# Patient Record
Sex: Male | Born: 1970
Health system: Southern US, Community
[De-identification: ages and names within clinical notes are randomized; demographics above are authoritative.]

## PROBLEM LIST (undated history)

## (undated) DIAGNOSIS — D75839 Thrombocytosis, unspecified: Secondary | ICD-10-CM

## (undated) DIAGNOSIS — J309 Allergic rhinitis, unspecified: Secondary | ICD-10-CM

## (undated) DIAGNOSIS — I10 Essential (primary) hypertension: Secondary | ICD-10-CM

## (undated) DIAGNOSIS — D473 Essential (hemorrhagic) thrombocythemia: Secondary | ICD-10-CM

## (undated) DIAGNOSIS — E785 Hyperlipidemia, unspecified: Secondary | ICD-10-CM

## (undated) DIAGNOSIS — L409 Psoriasis, unspecified: Secondary | ICD-10-CM

## (undated) HISTORY — DX: Hyperlipidemia, unspecified: E78.5

## (undated) HISTORY — PX: KNEE SURGERY: SHX244

## (undated) HISTORY — DX: Psoriasis, unspecified: L40.9

## (undated) HISTORY — DX: Essential (hemorrhagic) thrombocythemia: D47.3

## (undated) HISTORY — DX: Essential (primary) hypertension: I10

## (undated) HISTORY — DX: Thrombocytosis, unspecified: D75.839

## (undated) HISTORY — DX: Allergic rhinitis, unspecified: J30.9

---

## 2012-09-02 ENCOUNTER — Encounter: Payer: Self-pay | Admitting: Internal Medicine

## 2012-09-02 ENCOUNTER — Ambulatory Visit: Payer: Self-pay | Admitting: Internal Medicine

## 2012-09-02 ENCOUNTER — Ambulatory Visit (INDEPENDENT_AMBULATORY_CARE_PROVIDER_SITE_OTHER): Payer: BC Managed Care – PPO | Admitting: Internal Medicine

## 2012-09-02 VITALS — BP 132/88 | HR 90 | Temp 98.9°F | Ht 67.0 in | Wt 223.0 lb

## 2012-09-02 DIAGNOSIS — Z0000 Encounter for general adult medical examination without abnormal findings: Secondary | ICD-10-CM

## 2012-09-02 DIAGNOSIS — R030 Elevated blood-pressure reading, without diagnosis of hypertension: Secondary | ICD-10-CM

## 2012-09-02 DIAGNOSIS — D75839 Thrombocytosis, unspecified: Secondary | ICD-10-CM

## 2012-09-02 DIAGNOSIS — L409 Psoriasis, unspecified: Secondary | ICD-10-CM

## 2012-09-02 DIAGNOSIS — D473 Essential (hemorrhagic) thrombocythemia: Secondary | ICD-10-CM

## 2012-09-02 DIAGNOSIS — L408 Other psoriasis: Secondary | ICD-10-CM

## 2012-09-02 DIAGNOSIS — Z23 Encounter for immunization: Secondary | ICD-10-CM

## 2012-09-02 NOTE — Assessment & Plan Note (Addendum)
Reviewed adult health maintenance protocols.  Patient has pre-hypertension. Encouraged regular exercise and low salt diet. He also discussed goal weight loss of 25 pounds over next 6 months. His BMI is elevated. Obtain screening labs. Patient updated with tetanus vaccine. He declines influenza vaccine. Colon cancer and prostate cancer screening deferred until age 40

## 2012-09-02 NOTE — Patient Instructions (Addendum)
Please monitor your blood pressure at home.  Return office within 2 months if blood pressure consistently greater than 130/80 Goal weight loss - 25 lbs over next 6 months Make dietary changes as discussed (lower your carbohydrate intake, low salt diet - [email or web address].org)

## 2012-09-02 NOTE — Assessment & Plan Note (Signed)
He has mild psoriasis limited to neck and groin area. He is followed by local dermatologist

## 2012-09-02 NOTE — Assessment & Plan Note (Signed)
Patient reports history of thrombocytosis. Monitor platelet counts.

## 2012-09-02 NOTE — Progress Notes (Signed)
Subjective:    Patient ID: Samuel Lyons, male    DOB: Nov 10, 1970, 42 y.o.   MRN: [id number]  HPI  43 year old African American male to establish and for routine physical. He denies any chronic medical problems. Patient reports episode of elevated blood pressure when he was a child. Patient reports blood pressure issues resolved on its own. He is followed by local dermatologist for psoriasis. His skin lesions are limited to neck, scalp and groin area.  Patient also reports history of unexplained thrombocytosis. He has not followed by hematologist.  Denies any family history of prostate cancer or colon cancer.  He tries to exercise to 3 times per week   Review of Systems  Constitutional: Negative for activity change, appetite change and unexpected weight change.  Eyes: Negative for visual disturbance.  Respiratory: Negative for cough, chest tightness and shortness of breath.   Cardiovascular: Negative for chest pain.  Genitourinary: Negative for difficulty urinating.  Neurological: Negative for headaches.  Gastrointestinal: Negative for abdominal pain, heartburn melena or hematochezia Psych: Negative for depression or anxiety ID: denies hx of STDs    Past Medical History  Diagnosis Date  . Allergic rhinitis   . Psoriasis   . Thrombocytosis     History   Social History  . Marital Status: Single    Spouse Name: N/A    Number of Children: N/A  . Years of Education: N/A   Occupational History  . Economist   Social History Main Topics  . Smoking status: Never Smoker   . Smokeless tobacco: Not on file  . Alcohol Use: Yes  . Drug Use: No  . Sexually Active: Not on file   Other Topics Concern  . Not on file   Social History Narrative   Married for 7 years- wife's name is MarlaG[place] as a Camera operator for [employer] children-twins fraternal (son & daughter age 47), son 44    No past surgical history on  file.  Family History  Problem Relation Age of Onset  . Ulcers Mother   . Hypertension Maternal Grandmother   . Stroke Maternal Grandfather   . Ovarian cancer Paternal Aunt     No Known Allergies  No current outpatient prescriptions on file prior to visit.    BP 132/88  Pulse 90  Temp 98.9 F (37.2 C) (Oral)  Ht 5\' 7"  (1.702 m)  Wt 223 lb (101.152 kg)  BMI 34.93 kg/m2  EKG shows normal sinus rhythm at 70 beats per minute  Objective:   Physical Exam  Constitutional: He is oriented to person, place, and time. He appears well-developed and well-nourished.  HENT:  Head: Normocephalic and atraumatic.  Right Ear: External ear normal.  Left Ear: External ear normal.  Mouth/Throat: Oropharynx is clear and moist.  Eyes: EOM are normal. Pupils are equal, round, and reactive to light.  Neck: Neck supple.       No carotid bruit  Cardiovascular: Normal rate, regular rhythm, normal heart sounds and intact distal pulses.   No murmur heard. Pulmonary/Chest: Effort normal and breath sounds normal. He has no wheezes.  Abdominal: Soft. Bowel sounds are normal. He exhibits no mass. There is no tenderness.  Genitourinary: Penis normal.       Right testicle smaller left, otherwise normal  Musculoskeletal: Normal range of motion. He exhibits no edema.  Lymphadenopathy:    He has no cervical adenopathy.  Neurological: He is alert and oriented to person, place, and  time. No cranial nerve deficit.  Skin: Skin is warm and dry.       Silvery plaques on right upper neck and left scrotal area  Psychiatric: He has a normal mood and affect. His behavior is normal.          Assessment & Plan:

## 2012-09-05 ENCOUNTER — Ambulatory Visit (INDEPENDENT_AMBULATORY_CARE_PROVIDER_SITE_OTHER): Payer: BC Managed Care – PPO | Admitting: Family

## 2012-09-05 ENCOUNTER — Encounter: Payer: Self-pay | Admitting: Family

## 2012-09-05 ENCOUNTER — Telehealth: Payer: Self-pay | Admitting: Internal Medicine

## 2012-09-05 VITALS — BP 122/78 | HR 68 | Temp 98.1°F | Wt 220.0 lb

## 2012-09-05 DIAGNOSIS — R220 Localized swelling, mass and lump, head: Secondary | ICD-10-CM

## 2012-09-05 DIAGNOSIS — T8062XA Other serum reaction due to vaccination, initial encounter: Secondary | ICD-10-CM

## 2012-09-05 DIAGNOSIS — T50Z95A Adverse effect of other vaccines and biological substances, initial encounter: Secondary | ICD-10-CM

## 2012-09-05 MED ORDER — METHYLPREDNISOLONE ACETATE 40 MG/ML IJ SUSP
80.0000 mg | Freq: Once | INTRAMUSCULAR | Status: AC
  Administered 2012-09-05: 80 mg via INTRAMUSCULAR

## 2012-09-05 NOTE — Telephone Encounter (Signed)
Patient Information: ° Caller Name: Braian ° Phone: [phone] ° Patient: Samuel Lyons, Samuel Lyons ° Gender: Male ° DOB: 07/25/1971 ° Age: 41 Years ° PCP: Yoo, Doe-Hyun (Robert) (Adults only) ° °Office Follow Up: ° Does the office need to follow up with this patient?: No ° Instructions For The Office: N/A ° °RN Note: ° Patient already has an appt scheduled for 0915 this am.  Concerned about having to pay a copay for this reaction? ° °Symptoms ° Reason For Call & Symptoms: Had a tetanus booster on 1/21 and developed facial swelling and tingling on 1/22. Continues to have swelling.  Denies any difficulty swallowing or breathing.  He took Benadryl this am. ° Reviewed Health History In EMR: Yes ° Reviewed Medications In EMR: Yes ° Reviewed Allergies In EMR: Yes ° Reviewed Surgeries / Procedures: Yes ° Date of Onset of Symptoms: 09/02/2012 ° Treatments Tried: Benadryl ° Treatments Tried Worked: No ° °Guideline(s) Used: ° Face Swelling ° °Disposition Per Guideline:  ° Go to Office Now ° °Reason For Disposition Reached:  ° Swelling began after taking a drug ° °Advice Given: ° N/A ° ° °

## 2012-09-05 NOTE — Telephone Encounter (Signed)
Patient Information:  Caller Name: Enzio  Phone: [phone]  Patient: Samuel Lyons, Samuel Lyons  Gender: Male  DOB: 27-Feb-1971  Age: 42 Years  PCP: Artist Pais Doe-Hyun Molly Maduro) (Adults only)  Office Follow Up:  Does the office need to follow up with this patient?: No  Instructions For The Office: N/A  RN Note:  Patient already has an appt scheduled for 0915 this am.  Concerned about having to pay a copay for this reaction?  Symptoms  Reason For Call & Symptoms: Had a tetanus booster on 1/21 and developed facial swelling and tingling on 1/22. Continues to have swelling.  Denies any difficulty swallowing or breathing.  He took Benadryl this am.  Reviewed Health History In EMR: Yes  Reviewed Medications In EMR: Yes  Reviewed Allergies In EMR: Yes  Reviewed Surgeries / Procedures: Yes  Date of Onset of Symptoms: 09/02/2012  Treatments Tried: Benadryl  Treatments Tried Worked: No  Guideline(s) Used:  Face Swelling  Disposition Per Guideline:   Go to Office Now  Reason For Disposition Reached:   Swelling began after taking a drug  Advice Given:  N/A

## 2012-09-05 NOTE — Progress Notes (Signed)
  Subjective:    Patient ID: Samuel Lyons, male    DOB: January 11, 1971, 42 y.o.   MRN: [id number]  HPI    Review of Systems  HENT:       Lip swelling       Objective:   Physical Exam  HENT:       Left upper lip, moderately swollen. nontender          Assessment & Plan:

## 2012-09-05 NOTE — Patient Instructions (Addendum)
Immunization Reaction You are having a reaction to your immunization. Reactions can show up as:  Tenderness or a red area at the injection site.  Fever.  Rash.  Muscle aches.  Headache. Tiredness may develop over the first 2 to 3 days after your shot. Severe reactions are very rare. Only take over-the-counter or prescription medicines for pain, discomfort, or fever as directed by your caregiver.  The DPT shot (diphtheria, tetanus, pertussis) may leave a painless lump at the injection site for several months. The MMR (measles, mumps, rubella) and VAR (chickenpox) vaccines can sometimes cause a fever, rash, or joint pain and swelling 2 weeks after the shot. Get plenty of rest and take your medicines as prescribed. See your doctor if your symptoms get worse, or if you are not better within 3 days. Document Released: 07/30/2005 Document Revised: 10/22/2011 Document Reviewed: 01/17/2007 [facility] Patient Information 2013 [place], [place].

## 2012-09-05 NOTE — Progress Notes (Signed)
  Subjective:    Patient ID: Samuel Lyons, male    DOB: 04-Feb-1971, 42 y.o.   MRN: [id number]  HPI 42 year old African American male, nonsmoker, patient of Dr. Artist Pais is in today with complaints of left upper lip swelling after receiving a TDAP immunization 2 days ago at physical exam. Reports symptoms originally starting of his tingling in his upper lip and swelling gradually follow. Swelling was worse at about 2:00 this morning at which she took Benadryl that helped. He continues to have swelling. He denies any changes in foods, medications, no new drinks. No changes in his routine. Denies any redness, irritation, swelling at the injection site. Has never had a reaction to tetanus before.    Review of Systems  Constitutional: Negative.   Cardiovascular: Negative.   Gastrointestinal: Negative.   Musculoskeletal: Negative.   Skin: Negative.   Neurological: Negative.   Psychiatric/Behavioral: Negative.    Past Medical History  Diagnosis Date  . Allergic rhinitis   . Psoriasis   . Thrombocytosis     History   Social History  . Marital Status: Single    Spouse Name: N/A    Number of Children: N/A  . Years of Education: N/A   Occupational History  . Economist   Social History Main Topics  . Smoking status: Never Smoker   . Smokeless tobacco: Not on file  . Alcohol Use: Yes  . Drug Use: No  . Sexually Active: Not on file   Other Topics Concern  . Not on file   Social History Narrative   Married for 7 years- wife's name is MarlaGrew up in [place] as a Camera operator for [employer] children-twins fraternal (son & daughter age 40), son 40    No past surgical history on file.  Family History  Problem Relation Age of Onset  . Ulcers Mother   . Hypertension Maternal Grandmother   . Stroke Maternal Grandfather   . Ovarian cancer Paternal Aunt     No Known Allergies  Current Outpatient Prescriptions on File Prior to Visit    Medication Sig Dispense Refill  . ketoconazole (NIZORAL) 2 % shampoo Once daily as needed.       No current facility-administered medications on file prior to visit.    BP 122/78  Pulse 68  Temp 98.1 F (36.7 C) (Oral)  Wt 220 lb (99.791 kg)  SpO2 98%chart   Objective:   Physical Exam  Constitutional: He is oriented to person, place, and time. He appears well-developed and well-nourished.  HENT:  Right Ear: External ear normal.  Left Ear: External ear normal.  Nose: Nose normal.  Mouth/Throat: Oropharynx is clear and moist.  Neck: Normal range of motion. Neck supple.  Cardiovascular: Normal rate, regular rhythm and normal heart sounds.   Pulmonary/Chest: Effort normal and breath sounds normal.  Neurological: He is alert and oriented to person, place, and time.  Skin: Skin is warm and dry.          Assessment & Plan:  Assessment: 1. Allergic Reaction to Tdap 2. Swelling of the upper lip  Plan: Depo-Medrol 80 mg IM x1. Benadryl as needed. Patient advised to call the office if symptoms worsen or persist. Recheck as scheduled, and as needed.

## 2013-09-16 ENCOUNTER — Other Ambulatory Visit (INDEPENDENT_AMBULATORY_CARE_PROVIDER_SITE_OTHER): Payer: BC Managed Care – PPO

## 2013-09-16 DIAGNOSIS — Z0000 Encounter for general adult medical examination without abnormal findings: Secondary | ICD-10-CM

## 2013-09-16 LAB — POCT URINALYSIS DIPSTICK
BILIRUBIN UA: NEGATIVE
Glucose, UA: NEGATIVE
Ketones, UA: NEGATIVE
Leukocytes, UA: NEGATIVE
NITRITE UA: NEGATIVE
RBC UA: NEGATIVE
Spec Grav, UA: >=1.03
UROBILINOGEN UA: 0.2
pH, UA: 6

## 2013-09-16 LAB — BASIC METABOLIC PANEL
BUN: 9 mg/dL (ref 6–23)
CHLORIDE: 102 meq/L (ref 96–112)
CO2: 28 mEq/L (ref 19–32)
Calcium: 9.3 mg/dL (ref 8.4–10.5)
Creatinine, Ser: 1.1 mg/dL (ref 0.4–1.5)
GFR: 100.54 mL/min (ref >60.00)
Glucose, Bld: 77 mg/dL (ref 70–99)
POTASSIUM: 4.4 meq/L (ref 3.5–5.1)
SODIUM: 138 meq/L (ref 135–145)

## 2013-09-16 LAB — HEPATIC FUNCTION PANEL
ALT: 35 U/L (ref 0–53)
AST: 31 U/L (ref 0–37)
Albumin: 4 g/dL (ref 3.5–5.2)
Alkaline Phosphatase: 105 U/L (ref 39–117)
BILIRUBIN TOTAL: 0.8 mg/dL (ref 0.3–1.2)
Bilirubin, Direct: 0.1 mg/dL (ref 0.0–0.3)
TOTAL PROTEIN: 6.9 g/dL (ref 6.0–8.3)

## 2013-09-16 LAB — LIPID PANEL
Cholesterol: 197 mg/dL (ref 0–200)
HDL: 42 mg/dL (ref >39.00)
LDL CALC: 121 mg/dL — AB (ref 0–99)
Total CHOL/HDL Ratio: 5
Triglycerides: 168 mg/dL — ABNORMAL HIGH (ref 0.0–149.0)
VLDL: 33.6 mg/dL (ref 0.0–40.0)

## 2013-09-16 LAB — CBC WITH DIFFERENTIAL/PLATELET
BASOS PCT: 0.3 % (ref 0.0–3.0)
Basophils Absolute: 0 10*3/uL (ref 0.0–0.1)
EOS PCT: 4.1 % (ref 0.0–5.0)
Eosinophils Absolute: 0.2 10*3/uL (ref 0.0–0.7)
HEMATOCRIT: 45.9 % (ref 39.0–52.0)
HEMOGLOBIN: 14.9 g/dL (ref 13.0–17.0)
LYMPHS ABS: 1.8 10*3/uL (ref 0.7–4.0)
Lymphocytes Relative: 31.7 % (ref 12.0–46.0)
MCHC: 32.3 g/dL (ref 30.0–36.0)
MCV: 81.9 fl (ref 78.0–100.0)
MONOS PCT: 5.8 % (ref 3.0–12.0)
Monocytes Absolute: 0.3 10*3/uL (ref 0.1–1.0)
NEUTROS ABS: 3.2 10*3/uL (ref 1.4–7.7)
Neutrophils Relative %: 58.1 % (ref 43.0–77.0)
Platelets: 240 10*3/uL (ref 150.0–400.0)
RBC: 5.61 Mil/uL (ref 4.22–5.81)
RDW: 14.4 % (ref 11.5–14.6)
WBC: 5.6 10*3/uL (ref 4.5–10.5)

## 2013-09-16 LAB — PSA: PSA: 0.35 ng/mL (ref 0.10–4.00)

## 2013-09-16 LAB — TSH: TSH: 1.4 u[IU]/mL (ref 0.35–5.50)

## 2013-09-23 ENCOUNTER — Ambulatory Visit (INDEPENDENT_AMBULATORY_CARE_PROVIDER_SITE_OTHER): Payer: BC Managed Care – PPO | Admitting: Internal Medicine

## 2013-09-23 ENCOUNTER — Encounter: Payer: Self-pay | Admitting: Internal Medicine

## 2013-09-23 VITALS — BP 142/94 | HR 72 | Temp 98.8°F | Ht 67.5 in | Wt 214.0 lb

## 2013-09-23 DIAGNOSIS — Z0000 Encounter for general adult medical examination without abnormal findings: Secondary | ICD-10-CM

## 2013-09-23 NOTE — Assessment & Plan Note (Addendum)
Reviewed adult health maintenance protocols. PSA and DRE normal.   I encouraged weight loss, regular exercise, and low salt diet.  Educational handout provided. Patient to monitor home blood pressure readings. Reevaluate in 2 months.  Screening labs reviewed in detail with patient.

## 2013-09-23 NOTE — Patient Instructions (Signed)
Start regular aerobic exercise as directed Follow low sodium diet Monitor your blood pressure at home with automated Blood pressure cuff

## 2013-09-23 NOTE — Addendum Note (Signed)
Addended by: Townsend Roger D on: 09/23/2013 03:31 PM   Modules accepted: Orders

## 2013-09-23 NOTE — Progress Notes (Signed)
Subjective:    Patient ID: Samuel Lyons, male    DOB: [birth year], 43 y.o.   MRN: [id number]  HPI  43 year old African American male with history of psoriasis for routine physical. At previous visit patient was given tetanus vaccine. He reports allergic reaction with lip swelling.  His symptoms resolved after getting IM Depo-Medrol 80 mg.  BP elevated today.  Social hx and family hx reviewed - no changes  Review of Systems  Constitutional: Negative for activity change, appetite change and unexpected weight change.  Eyes: Negative for visual disturbance.  Respiratory: Negative for cough, chest tightness and shortness of breath.   Cardiovascular: Negative for chest pain.  Genitourinary: Negative for difficulty urinating.  Neurological: Negative for headaches.  Gastrointestinal: Negative for abdominal pain, heartburn melena or hematochezia Psych: Negative for depression or anxiety Endo:  No polyuria or polydypsia        Past Medical History  Diagnosis Date  . Allergic rhinitis   . Psoriasis   . Thrombocytosis     History   Social History  . Marital Status: Single    Spouse Name: N/A    Number of Children: N/A  . Years of Education: N/A   Occupational History  . Engineer, technical sales   Social History Main Topics  . Smoking status: Never Smoker   . Smokeless tobacco: Not on file  . Alcohol Use: Yes  . Drug Use: No  . Sexual Activity: Not on file   Other Topics Concern  . Not on file   Social History Narrative   Married for 7 years- wife's name is Leeanne Mannan up in [place]   Works as a Psychologist, forensic for [employer]   3 children-twins fraternal (son & daughter age 18), son 39    No past surgical history on file.  Family History  Problem Relation Age of Onset  . Ulcers Mother   . Hypertension Maternal Grandmother   . Stroke Maternal Grandfather   . Ovarian cancer Paternal Aunt     No Known Allergies  Current Outpatient  Prescriptions on File Prior to Visit  Medication Sig Dispense Refill  . ketoconazole (NIZORAL) 2 % shampoo Once daily as needed.       No current facility-administered medications on file prior to visit.    BP 142/94  Pulse 72  Temp(Src) 98.8 F (37.1 C) (Oral)  Ht 5' 7.5" (1.715 m)  Wt 214 lb (97.07 kg)  BMI 33.00 kg/m2    Objective:   Physical Exam  Constitutional: He is oriented to person, place, and time. He appears well-developed and well-nourished. No distress.  HENT:  Head: Normocephalic and atraumatic.  Right Ear: External ear normal.  Left Ear: External ear normal.  Mouth/Throat: Oropharynx is clear and moist.  Eyes: EOM are normal. Pupils are equal, round, and reactive to light. No scleral icterus.  Neck: Neck supple.  Cardiovascular: Normal rate, regular rhythm, normal heart sounds and intact distal pulses.   No murmur heard. Pulmonary/Chest: Effort normal and breath sounds normal. He has no wheezes. He has no rales.  Abdominal: Soft. Bowel sounds are normal. He exhibits no mass. There is no tenderness.  Genitourinary: Rectum normal and prostate normal. Guaiac negative stool.  Musculoskeletal: Normal range of motion. He exhibits no edema.  Lymphadenopathy:    He has no cervical adenopathy.  Neurological: He is alert and oriented to person, place, and time. No cranial nerve deficit.  Skin: Skin is warm and dry.  Psychiatric: He has a normal mood and affect. His behavior is normal.          Assessment & Plan:

## 2013-09-23 NOTE — Progress Notes (Signed)
Pre visit review using our clinic review tool, if applicable. No additional management support is needed unless otherwise documented below in the visit note. 

## 2013-11-20 ENCOUNTER — Ambulatory Visit (INDEPENDENT_AMBULATORY_CARE_PROVIDER_SITE_OTHER): Payer: BC Managed Care – PPO | Admitting: Internal Medicine

## 2013-11-20 ENCOUNTER — Encounter: Payer: Self-pay | Admitting: Internal Medicine

## 2013-11-20 VITALS — BP 150/100 | HR 68 | Temp 98.5°F | Ht 67.5 in | Wt 208.0 lb

## 2013-11-20 DIAGNOSIS — I10 Essential (primary) hypertension: Secondary | ICD-10-CM | POA: Insufficient documentation

## 2013-11-20 DIAGNOSIS — J309 Allergic rhinitis, unspecified: Secondary | ICD-10-CM | POA: Insufficient documentation

## 2013-11-20 MED ORDER — LOSARTAN POTASSIUM 50 MG PO TABS: 50.0000 mg | ORAL_TABLET | Freq: Every day | ORAL | Status: DC

## 2013-11-20 MED ORDER — FLUTICASONE PROPIONATE 50 MCG/ACT NA SUSP: 2.0000 | Freq: Every day | NASAL | Status: DC

## 2013-11-20 MED ORDER — METHYLPREDNISOLONE ACETATE 80 MG/ML IJ SUSP
80.0000 mg | Freq: Once | INTRAMUSCULAR | Status: AC
  Administered 2013-11-20: 80 mg via INTRAMUSCULAR

## 2013-11-20 MED ORDER — DESLORATADINE 5 MG PO TABS: 5.0000 mg | ORAL_TABLET | Freq: Every day | ORAL | Status: DC

## 2013-11-20 NOTE — Patient Instructions (Signed)
Please complete the following lab tests before your next follow up appointment: BMET - 401.9 Reduce your sodium intake to 3 grams per day Please contact our office if your allegy symptoms do not improve or gets worse.

## 2013-11-20 NOTE — Assessment & Plan Note (Signed)
Patient has stage I hypertension. Start losartan 50 mg once daily. Continue regular exercise low salt diet. Reassess in 4 weeks. Monitor electrolytes and kidney function. BP: 150/100 mmHg

## 2013-11-20 NOTE — Assessment & Plan Note (Signed)
Patient having significant flare of allergic rhinitis. He has failed over-the-counter nonsedating antihistamines and nasal steroids. Patient given Depo-Medrol 80 mg IM x1.  Continue intranasal steroids, antihistamine (over-the-counter), and intranasal saline. Patient advised to call office if symptoms persist or worsen.

## 2013-11-20 NOTE — Progress Notes (Signed)
Pre visit review using our clinic review tool, if applicable. No additional management support is needed unless otherwise documented below in the visit note. 

## 2013-11-20 NOTE — Progress Notes (Signed)
   Subjective:    Patient ID: Samuel Lyons, male    DOB: 10-Dec-1970, 43 y.o.   MRN: [id number]  HPI  43 year old African American male for followup regarding pre-hypertension. Patient has not been monitoring his blood pressure at home. Patient has made effort towards weight loss and has lost approximately 6 pounds since previous visit.  Patient complains today of hoarseness and postnasal drip. Symptoms worse at night. He has history of seasonal allergies. However this year has been particularly bad for allergy symptoms. He denies any sore throat. He denies any facial pressure sinus pain.   Review of Systems Negative for fever chills, negative for sinus pain    Past Medical History  Diagnosis Date  . Allergic rhinitis   . Psoriasis   . Thrombocytosis     History   Social History  . Marital Status: Single    Spouse Name: N/A    Number of Children: N/A  . Years of Education: N/A   Occupational History  . Engineer, technical sales   Social History Main Topics  . Smoking status: Never Smoker   . Smokeless tobacco: Not on file  . Alcohol Use: Yes  . Drug Use: No  . Sexual Activity: Not on file   Other Topics Concern  . Not on file   Social History Narrative   Married for 7 years- wife's name is Leeanne Mannan up in [place]   Works as a Psychologist, forensic for [employer]   3 children-twins fraternal (son & daughter age 35), son 50    No past surgical history on file.  Family History  Problem Relation Age of Onset  . Ulcers Mother   . Hypertension Maternal Grandmother   . Stroke Maternal Grandfather   . Ovarian cancer Paternal Aunt     Allergies  Allergen Reactions  . Tetanus Toxoids Swelling    Lip swelling    Current Outpatient Prescriptions on File Prior to Visit  Medication Sig Dispense Refill  . ketoconazole (NIZORAL) 2 % shampoo Once daily as needed.       No current facility-administered medications on file prior to visit.    BP  150/100  Pulse 68  Temp(Src) 98.5 F (36.9 C) (Oral)  Ht 5' 7.5" (1.715 m)  Wt 208 lb (94.348 kg)  BMI 32.08 kg/m2      Objective:   Physical Exam  Constitutional: He is oriented to person, place, and time. He appears well-developed and well-nourished. No distress.  HENT:  Head: Normocephalic and atraumatic.  Right Ear: External ear normal.  Left tympanic membrane slightly retracted, mild oropharyngeal erythema  Neck: Neck supple.  Cardiovascular: Normal rate, regular rhythm and normal heart sounds.   No murmur heard. Pulmonary/Chest: Effort normal and breath sounds normal. He has no wheezes.  Musculoskeletal: He exhibits no edema.  Lymphadenopathy:    He has no cervical adenopathy.  Neurological: He is oriented to person, place, and time. No cranial nerve deficit.  Skin: Skin is warm and dry.  2 x 3 cm tumor plaque below right mandible  Psychiatric: He has a normal mood and affect. His behavior is normal.          Assessment & Plan:

## 2013-11-25 ENCOUNTER — Ambulatory Visit: Payer: BC Managed Care – PPO | Admitting: Internal Medicine

## 2013-12-16 ENCOUNTER — Ambulatory Visit (INDEPENDENT_AMBULATORY_CARE_PROVIDER_SITE_OTHER): Payer: BC Managed Care – PPO | Admitting: Internal Medicine

## 2013-12-16 ENCOUNTER — Encounter: Payer: Self-pay | Admitting: Internal Medicine

## 2013-12-16 VITALS — BP 116/84 | HR 76 | Temp 98.0°F | Ht 67.5 in | Wt 208.0 lb

## 2013-12-16 DIAGNOSIS — I10 Essential (primary) hypertension: Secondary | ICD-10-CM

## 2013-12-16 LAB — BASIC METABOLIC PANEL
BUN: 12 mg/dL (ref 6–23)
CALCIUM: 9.4 mg/dL (ref 8.4–10.5)
CO2: 27 meq/L (ref 19–32)
Chloride: 102 mEq/L (ref 96–112)
Creatinine, Ser: 1 mg/dL (ref 0.4–1.5)
GFR: 102.67 mL/min (ref >60.00)
Glucose, Bld: 91 mg/dL (ref 70–99)
Potassium: 3.8 mEq/L (ref 3.5–5.1)
SODIUM: 135 meq/L (ref 135–145)

## 2013-12-16 MED ORDER — LOSARTAN POTASSIUM 50 MG PO TABS: 50.0000 mg | ORAL_TABLET | Freq: Every day | ORAL | Status: DC

## 2013-12-16 NOTE — Progress Notes (Signed)
Pre visit review using our clinic review tool, if applicable. No additional management support is needed unless otherwise documented below in the visit note. 

## 2013-12-16 NOTE — Progress Notes (Signed)
   Subjective:    Patient ID: Samuel Lyons, male    DOB: December 01, 1970, 43 y.o.   MRN: [id number]  HPI  43 year old [place] American male for followup regarding hypertension. At previous visit patient started on losartan 50 mg once daily. Patient denies any adverse effects. He denies dizziness or lightheadedness.  His blood pressure is well-controlled.   Review of Systems Negative for chest pain    Past Medical History  Diagnosis Date  . Allergic rhinitis   . Psoriasis   . Thrombocytosis     History   Social History  . Marital Status: Single    Spouse Name: N/A    Number of Children: N/A  . Years of Education: N/A   Occupational History  . Engineer, technical sales   Social History Main Topics  . Smoking status: Never Smoker   . Smokeless tobacco: Not on file  . Alcohol Use: Yes  . Drug Use: No  . Sexual Activity: Not on file   Other Topics Concern  . Not on file   Social History Narrative   Married for 7 years- wife's name is Samuel Lyons up in [place]   Works as a Psychologist, forensic for [employer]   3 children-twins fraternal (son & daughter age 40), son 6    No past surgical history on file.  Family History  Problem Relation Age of Onset  . Ulcers Mother   . Hypertension Maternal Grandmother   . Stroke Maternal Grandfather   . Ovarian cancer Paternal Aunt     Allergies  Allergen Reactions  . Tetanus Toxoids Swelling    Lip swelling    Current Outpatient Prescriptions on File Prior to Visit  Medication Sig Dispense Refill  . desloratadine (CLARINEX) 5 MG tablet Take 1 tablet (5 mg total) by mouth daily.  90 tablet  1  . fluticasone (FLONASE) 50 MCG/ACT nasal spray Place 2 sprays into both nostrils daily.  16 g  5  . ketoconazole (NIZORAL) 2 % shampoo Once daily as needed.       No current facility-administered medications on file prior to visit.    BP 116/84  Pulse 76  Temp(Src) 98 F (36.7 C) (Oral)  Ht 5' 7.5" (1.715 m)   Wt 208 lb (94.348 kg)  BMI 32.08 kg/m2    Objective:   Physical Exam  Constitutional: He is oriented to person, place, and time. He appears well-developed and well-nourished.  Cardiovascular: Normal rate, regular rhythm and normal heart sounds.   No murmur heard. Pulmonary/Chest: Effort normal and breath sounds normal. He has no wheezes.  Musculoskeletal: He exhibits no edema.  Neurological: He is alert and oriented to person, place, and time. No cranial nerve deficit.          Assessment & Plan:

## 2013-12-16 NOTE — Assessment & Plan Note (Signed)
Good response to Losartan 50 mg.  Monitor electrolytes and kidney function. His 10-year cardiac vascular risk is less than 5%.  Continue with lifestyle / dietary changes. BP: 116/84 mmHg

## 2013-12-18 ENCOUNTER — Ambulatory Visit: Payer: BC Managed Care – PPO | Admitting: Internal Medicine

## 2014-06-18 ENCOUNTER — Ambulatory Visit: Payer: BC Managed Care – PPO | Admitting: Internal Medicine

## 2014-08-09 ENCOUNTER — Ambulatory Visit: Payer: BC Managed Care – PPO | Admitting: Internal Medicine

## 2014-08-20 ENCOUNTER — Ambulatory Visit: Payer: BC Managed Care – PPO | Admitting: Internal Medicine

## 2014-08-23 ENCOUNTER — Ambulatory Visit: Payer: BC Managed Care – PPO | Admitting: Internal Medicine

## 2014-08-24 ENCOUNTER — Emergency Department (HOSPITAL_COMMUNITY)
Admission: EM | Admit: 2014-08-24 | Discharge: 2014-08-24 | Disposition: A | Discharge status: Home / Self Care | Payer: No Typology Code available for payment source | Attending: Emergency Medicine | Admitting: Emergency Medicine

## 2014-08-24 ENCOUNTER — Encounter (HOSPITAL_COMMUNITY): Payer: Self-pay | Admitting: Emergency Medicine

## 2014-08-24 ENCOUNTER — Emergency Department (HOSPITAL_COMMUNITY): Payer: No Typology Code available for payment source

## 2014-08-24 DIAGNOSIS — R202 Paresthesia of skin: Secondary | ICD-10-CM | POA: Insufficient documentation

## 2014-08-24 DIAGNOSIS — Z7951 Long term (current) use of inhaled steroids: Secondary | ICD-10-CM | POA: Insufficient documentation

## 2014-08-24 DIAGNOSIS — Y92410 Unspecified street and highway as the place of occurrence of the external cause: Secondary | ICD-10-CM | POA: Insufficient documentation

## 2014-08-24 DIAGNOSIS — S199XXA Unspecified injury of neck, initial encounter: Secondary | ICD-10-CM | POA: Diagnosis not present

## 2014-08-24 DIAGNOSIS — M542 Cervicalgia: Secondary | ICD-10-CM

## 2014-08-24 DIAGNOSIS — S0990XA Unspecified injury of head, initial encounter: Secondary | ICD-10-CM | POA: Diagnosis not present

## 2014-08-24 DIAGNOSIS — Y998 Other external cause status: Secondary | ICD-10-CM | POA: Insufficient documentation

## 2014-08-24 DIAGNOSIS — Y9389 Activity, other specified: Secondary | ICD-10-CM | POA: Insufficient documentation

## 2014-08-24 DIAGNOSIS — Z872 Personal history of diseases of the skin and subcutaneous tissue: Secondary | ICD-10-CM | POA: Insufficient documentation

## 2014-08-24 DIAGNOSIS — Z862 Personal history of diseases of the blood and blood-forming organs and certain disorders involving the immune mechanism: Secondary | ICD-10-CM | POA: Diagnosis not present

## 2014-08-24 DIAGNOSIS — Z79899 Other long term (current) drug therapy: Secondary | ICD-10-CM | POA: Diagnosis not present

## 2014-08-24 MED ORDER — CYCLOBENZAPRINE HCL 10 MG PO TABS: 10.0000 mg | ORAL_TABLET | Freq: Three times a day (TID) | ORAL | Status: DC | PRN

## 2014-08-24 MED ORDER — ACETAMINOPHEN 325 MG PO TABS
650.0000 mg | ORAL_TABLET | Freq: Once | ORAL | Status: AC
  Administered 2014-08-24: 650 mg via ORAL
  Filled 2014-08-24: qty 2

## 2014-08-24 MED ORDER — OXYCODONE-ACETAMINOPHEN 5-325 MG PO TABS: 1.0000 | ORAL_TABLET | Freq: Four times a day (QID) | ORAL | Status: DC | PRN

## 2014-08-24 MED ORDER — HYDROMORPHONE HCL 1 MG/ML IJ SOLN
1.0000 mg | Freq: Once | INTRAMUSCULAR | Status: AC
  Administered 2014-08-24: 1 mg via INTRAMUSCULAR
  Filled 2014-08-24: qty 1

## 2014-08-24 NOTE — Discharge Instructions (Signed)
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
## 2014-08-26 ENCOUNTER — Encounter (HOSPITAL_BASED_OUTPATIENT_CLINIC_OR_DEPARTMENT_OTHER): Payer: Self-pay | Admitting: Emergency Medicine

## 2014-08-27 ENCOUNTER — Encounter: Payer: Self-pay | Admitting: *Deleted

## 2014-08-27 ENCOUNTER — Encounter: Payer: Self-pay | Admitting: Family Medicine

## 2014-08-27 ENCOUNTER — Ambulatory Visit (INDEPENDENT_AMBULATORY_CARE_PROVIDER_SITE_OTHER): Payer: BLUE CROSS/BLUE SHIELD | Admitting: Family Medicine

## 2014-08-27 VITALS — BP 140/98 | HR 99 | Temp 98.3°F | Ht 68.0 in | Wt 212.7 lb

## 2014-08-27 DIAGNOSIS — M545 Low back pain, unspecified: Secondary | ICD-10-CM

## 2014-08-27 DIAGNOSIS — J309 Allergic rhinitis, unspecified: Secondary | ICD-10-CM

## 2014-08-27 DIAGNOSIS — I10 Essential (primary) hypertension: Secondary | ICD-10-CM

## 2014-08-27 DIAGNOSIS — M25512 Pain in left shoulder: Secondary | ICD-10-CM

## 2014-08-27 MED ORDER — DESLORATADINE 5 MG PO TABS: 5.0000 mg | ORAL_TABLET | Freq: Every day | ORAL | Status: DC

## 2014-08-27 MED ORDER — LOSARTAN POTASSIUM 50 MG PO TABS: 50.0000 mg | ORAL_TABLET | Freq: Every day | ORAL | Status: DC

## 2014-08-27 NOTE — Patient Instructions (Signed)
Please stay active to avoid stiffness  Heat for 15 minutes twice daily and topical sports creams  Tylenol and aleve per instructions if needed for pain  Flexeril at night as needed before bed  Follow up as needed

## 2014-08-27 NOTE — Progress Notes (Signed)
Pre visit review using our clinic review tool, if applicable. No additional management support is needed unless otherwise documented below in the visit note. 
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
## 2014-08-30 ENCOUNTER — Telehealth: Payer: Self-pay | Admitting: Internal Medicine

## 2014-08-30 NOTE — Telephone Encounter (Signed)
emmi emailed °
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
## 2015-11-23 ENCOUNTER — Ambulatory Visit (INDEPENDENT_AMBULATORY_CARE_PROVIDER_SITE_OTHER): Payer: BLUE CROSS/BLUE SHIELD | Admitting: Adult Health

## 2015-11-23 ENCOUNTER — Telehealth: Payer: Self-pay | Admitting: Internal Medicine

## 2015-11-23 VITALS — BP 124/98 | Temp 98.1°F | Wt 222.0 lb

## 2015-11-23 DIAGNOSIS — J309 Allergic rhinitis, unspecified: Secondary | ICD-10-CM

## 2015-11-23 DIAGNOSIS — I10 Essential (primary) hypertension: Secondary | ICD-10-CM | POA: Diagnosis not present

## 2015-11-23 DIAGNOSIS — L409 Psoriasis, unspecified: Secondary | ICD-10-CM

## 2015-11-23 MED ORDER — LOSARTAN POTASSIUM 50 MG PO TABS: 50.0000 mg | ORAL_TABLET | Freq: Every day | ORAL | Status: DC

## 2015-11-23 MED ORDER — KETOCONAZOLE 2 % EX SHAM: 1.0000 "application " | MEDICATED_SHAMPOO | CUTANEOUS | Status: DC

## 2015-11-23 MED ORDER — FLUTICASONE PROPIONATE 50 MCG/ACT NA SUSP: 2.0000 | Freq: Every day | NASAL | Status: DC

## 2015-11-23 MED ORDER — TRIAMCINOLONE ACETONIDE 0.5 % EX OINT: 1.0000 "application " | TOPICAL_OINTMENT | Freq: Two times a day (BID) | CUTANEOUS | Status: DC

## 2015-11-23 MED ORDER — DESLORATADINE 5 MG PO TABS: 5.0000 mg | ORAL_TABLET | Freq: Every day | ORAL | Status: DC

## 2015-11-23 NOTE — Telephone Encounter (Signed)
Okay to see one of our providers.  Please call and schedule.  thanks

## 2015-11-23 NOTE — Progress Notes (Signed)
Subjective:    Patient ID: Samuel Lyons, male    DOB: Jan 22, 1971, 45 y.o.   MRN: [id number]  HPI  45 year old male who presents to the office today for issues related to plaque psoriasis. He has had 4 spots pop up recently. He tried to get into the dermatologist but the soonest he could get in was 2 months from now.   He has psoriasis on both sides of his head, on his right abdomen and on his light thigh.   He also needs his blood pressure medication and allergy medication refilled.   Review of Systems  All other systems reviewed and are negative.  Past Medical History  Diagnosis Date  . Allergic rhinitis   . Psoriasis   . Thrombocytosis     Social History   Social History  . Marital Status: Single    Spouse Name: N/A  . Number of Children: N/A  . Years of Education: N/A   Occupational History  . Engineer, technical sales   Social History Main Topics  . Smoking status: Never Smoker   . Smokeless tobacco: Not on file  . Alcohol Use: Yes  . Drug Use: No  . Sexual Activity: Not on file   Other Topics Concern  . Not on file   Social History Narrative   Married for 7 years- wife's name is Leeanne Mannan up in [place]   Works as a Psychologist, forensic for [employer]   3 children-twins fraternal (son & daughter age 64), son 24    Past Surgical History  Procedure Laterality Date  . Knee surgery Right     Family History  Problem Relation Age of Onset  . Ulcers Mother   . Hypertension Maternal Grandmother   . Stroke Maternal Grandfather   . Ovarian cancer Paternal Aunt     Allergies  Allergen Reactions  . Tetanus Toxoids Swelling    Lip swelling  . Percocet [Oxycodone-Acetaminophen] Nausea And Vomiting    Current Outpatient Prescriptions on File Prior to Visit  Medication Sig Dispense Refill  . cyclobenzaprine (FLEXERIL) 10 MG tablet Take 1 tablet (10 mg total) by mouth 3 (three) times daily as needed for muscle spasms. (Patient not  taking: Reported on 11/23/2015) 15 tablet 0   No current facility-administered medications on file prior to visit.    BP 124/98 mmHg  Temp(Src) 98.1 F (36.7 C) (Oral)  Wt 222 lb (100.699 kg)       Objective:   Physical Exam  Constitutional: He is oriented to person, place, and time. He appears well-developed and well-nourished. No distress.  Neurological: He is alert and oriented to person, place, and time.  Skin: Skin is warm and dry. Rash noted. He is not diaphoretic. No pallor.  Psychiatric: He has a normal mood and affect. His behavior is normal. Judgment and thought content normal.  Nursing note and vitals reviewed.      Assessment & Plan:  1. Psoriasis - ketoconazole (NIZORAL) 2 % shampoo; Apply 1 application topically 2 (two) times a week.  Dispense: 120 mL; Refill: 2 - triamcinolone ointment (KENALOG) 0.5 %; Apply 1 application topically 2 (two) times daily.  Dispense: 30 g; Refill: 0  2. Essential hypertension - losartan (COZAAR) 50 MG tablet; Take 1 tablet (50 mg total) by mouth daily.  Dispense: 90 tablet; Refill: 0  3. Allergic rhinitis, unspecified allergic rhinitis type - fluticasone (FLONASE) 50 MCG/ACT nasal spray; Place 2 sprays into both nostrils  daily.  Dispense: 16 g; Refill: 5 - desloratadine (CLARINEX) 5 MG tablet; Take 1 tablet (5 mg total) by mouth daily.  Dispense: 90 tablet; Refill: 3   Dorothyann Peng, NP

## 2015-11-23 NOTE — Telephone Encounter (Signed)
Pt scheduled  

## 2015-11-23 NOTE — Telephone Encounter (Signed)
Pt is having psoriasis breakout and his dermatologist can not see him and he call here to make an appt. Can he be seen here or does he need to see his dermatologist

## 2015-12-26 ENCOUNTER — Other Ambulatory Visit: Payer: Self-pay

## 2015-12-26 DIAGNOSIS — I10 Essential (primary) hypertension: Secondary | ICD-10-CM

## 2015-12-26 MED ORDER — LOSARTAN POTASSIUM 50 MG PO TABS: 50.0000 mg | ORAL_TABLET | Freq: Every day | ORAL | Status: DC

## 2016-01-04 ENCOUNTER — Encounter: Payer: Self-pay | Admitting: Adult Health

## 2016-01-04 ENCOUNTER — Ambulatory Visit (INDEPENDENT_AMBULATORY_CARE_PROVIDER_SITE_OTHER): Payer: BLUE CROSS/BLUE SHIELD | Admitting: Adult Health

## 2016-01-04 ENCOUNTER — Other Ambulatory Visit: Payer: Self-pay

## 2016-01-04 ENCOUNTER — Telehealth: Payer: Self-pay | Admitting: Internal Medicine

## 2016-01-04 VITALS — BP 142/82 | Temp 98.1°F | Ht 68.0 in | Wt 223.0 lb

## 2016-01-04 DIAGNOSIS — I10 Essential (primary) hypertension: Secondary | ICD-10-CM

## 2016-01-04 DIAGNOSIS — R202 Paresthesia of skin: Secondary | ICD-10-CM | POA: Diagnosis not present

## 2016-01-04 DIAGNOSIS — L409 Psoriasis, unspecified: Secondary | ICD-10-CM

## 2016-01-04 MED ORDER — TRIAMCINOLONE ACETONIDE 0.5 % EX OINT: 1.0000 "application " | TOPICAL_OINTMENT | Freq: Two times a day (BID) | CUTANEOUS | Status: DC

## 2016-01-04 MED ORDER — PREDNISONE 20 MG PO TABS: 20.0000 mg | ORAL_TABLET | Freq: Every day | ORAL | Status: DC

## 2016-01-04 NOTE — Telephone Encounter (Signed)
Letter printed and ready for pick up. Patient's wife notified.

## 2016-01-04 NOTE — Progress Notes (Signed)
Subjective:    Patient ID: Samuel Lyons, male    DOB: 1971-05-29, 45 y.o.   MRN: [id number]  HPI   45 year old healthy male who presents to the office today for tingling in bilateral hands for one month. He reports that the tingling is always there. It does not get worse at night. He has not lost any grip strength nor does he have any numbness or tingling in his arms.He denies any back pain or numbness  He does use a computer a lot and is always using his hands at [employer].    His blood pressure continues to be slightly elevated. He is eating a lot of processed foods. He is working on changing his diet. He would like more time to do so before going up on blood pressure medication.   Review of Systems  Constitutional: Negative.   Respiratory: Negative.   Cardiovascular: Negative.   Musculoskeletal: Negative.   Skin: Negative.   Neurological:       Tingling in hands  Psychiatric/Behavioral: Negative.    Past Medical History  Diagnosis Date  . Allergic rhinitis   . Psoriasis   . Thrombocytosis [facility])     Social History   Social History  . Marital Status: Single    Spouse Name: N/A  . Number of Children: N/A  . Years of Education: N/A   Occupational History  . Engineer, technical sales   Social History Main Topics  . Smoking status: Never Smoker   . Smokeless tobacco: Not on file  . Alcohol Use: Yes  . Drug Use: No  . Sexual Activity: Not on file   Other Topics Concern  . Not on file   Social History Narrative   Married for 7 years- wife's name is Leeanne Mannan up in [place]   Works as a Psychologist, forensic for [employer]   3 children-twins fraternal (son & daughter age 1), son 66    Past Surgical History  Procedure Laterality Date  . Knee surgery Right     Family History  Problem Relation Age of Onset  . Ulcers Mother   . Hypertension Maternal Grandmother   . Stroke Maternal Grandfather   . Ovarian cancer Paternal Aunt     Allergies   Allergen Reactions  . Tetanus Toxoids Swelling    Lip swelling  . Percocet [Oxycodone-Acetaminophen] Nausea And Vomiting    Current Outpatient Prescriptions on File Prior to Visit  Medication Sig Dispense Refill  . desloratadine (CLARINEX) 5 MG tablet Take 1 tablet (5 mg total) by mouth daily. 90 tablet 3  . fluticasone (FLONASE) 50 MCG/ACT nasal spray Place 2 sprays into both nostrils daily. 16 g 5  . ketoconazole (NIZORAL) 2 % shampoo Apply 1 application topically 2 (two) times a week. 120 mL 2  . losartan (COZAAR) 50 MG tablet Take 1 tablet (50 mg total) by mouth daily. 30 tablet 0  . triamcinolone ointment (KENALOG) 0.5 % Apply 1 application topically 2 (two) times daily. 30 g 0   No current facility-administered medications on file prior to visit.    BP 142/82 mmHg  Temp(Src) 98.1 F (36.7 C) (Oral)  Ht 5\' 8"  (1.727 m)  Wt 223 lb (101.152 kg)  BMI 33.91 kg/m2       Objective:   Physical Exam  Constitutional: He is oriented to person, place, and time. He appears well-developed and well-nourished. No distress.  Cardiovascular: Normal rate, regular rhythm, normal heart sounds and  intact distal pulses.  Exam reveals no gallop and no friction rub.   No murmur heard. Good cap refill   Pulmonary/Chest: Effort normal and breath sounds normal. No respiratory distress. He has no wheezes. He has no rales. He exhibits no tenderness.  Musculoskeletal: Normal range of motion. He exhibits no edema or tenderness.  Neurological: He is alert and oriented to person, place, and time. He has normal reflexes. He displays normal reflexes. No cranial nerve deficit. He exhibits normal muscle tone. Coordination normal.  Negative Phalens Questionable positive Tinel's sign.  No loss of grip strength  Skin: Skin is warm and dry. No rash noted. He is not diaphoretic. No erythema. No pallor.  Psychiatric: He has a normal mood and affect. His behavior is normal. Judgment and thought content normal.    Nursing note and vitals reviewed.     Assessment & Plan:  1. Tingling in extremities - Possible carpal tunnel. Doubt peripheral neuropathy. His blood sugars have been controlled, doubt diabetes. Doubt vitamin deficiency  - predniSONE (DELTASONE) 20 MG tablet; Take 1 tablet (20 mg total) by mouth daily with breakfast.  Dispense: 14 tablet; Refill: 0 - Follow up in two weeks - Consider referral to neurology for nerve conduction studies  2. Essential hypertension - No change at this time.  - Work on reducing sodiium  - Drink plenty of water - Will continue to monitor and if needed, increase losartan   Dorothyann Peng, NP

## 2016-01-04 NOTE — Patient Instructions (Addendum)
I have sent in a prescription for  Prednisone 20 mg, take this daily for two weeks.   Follow up with me in two weeks for reevaluation.   If the tingling gets worse, then follow up with me sooner  Carpal Tunnel Syndrome Carpal tunnel syndrome is a condition that causes pain in your hand and arm. The carpal tunnel is a narrow area located on the palm side of your wrist. Repeated wrist motion or certain diseases may cause swelling within the tunnel. This swelling pinches the main nerve in the wrist (median nerve). CAUSES  This condition may be caused by:   Repeated wrist motions.  Wrist injuries.  Arthritis.  A cyst or tumor in the carpal tunnel.  Fluid buildup during pregnancy. Sometimes the cause of this condition is not known.  RISK FACTORS This condition is more likely to develop in:   People who have jobs that cause them to repeatedly move their wrists in the same motion, such as butchers and cashiers.  Women.  People with certain conditions, such as:  Diabetes.  Obesity.  An underactive thyroid (hypothyroidism).  Kidney failure. SYMPTOMS  Symptoms of this condition include:   A tingling feeling in your fingers, especially in your thumb, index, and middle fingers.  Tingling or numbness in your hand.  An aching feeling in your entire arm, especially when your wrist and elbow are bent for long periods of time.  Wrist pain that goes up your arm to your shoulder.  Pain that goes down into your palm or fingers.  A weak feeling in your hands. You may have trouble grabbing and holding items. Your symptoms may feel worse during the night.  DIAGNOSIS  This condition is diagnosed with a medical history and physical exam. You may also have tests, including:   An electromyogram (EMG). This test measures electrical signals sent by your nerves into the muscles.  X-rays. TREATMENT  Treatment for this condition includes:  Lifestyle changes. It is important to stop doing  or modify the activity that caused your condition.  Physical or occupational therapy.  Medicines for pain and inflammation. This may include medicine that is injected into your wrist.  A wrist splint.  Surgery. HOME CARE INSTRUCTIONS  If You Have a Splint:  Wear it as told by your health care provider. Remove it only as told by your health care provider.  Loosen the splint if your fingers become numb and tingle, or if they turn cold and blue.  Keep the splint clean and dry. General Instructions  Take over-the-counter and prescription medicines only as told by your health care provider.  Rest your wrist from any activity that may be causing your pain. If your condition is work related, talk to your employer about changes that can be made, such as getting a wrist pad to use while typing.  If directed, apply ice to the painful area:  Put ice in a plastic bag.  Place a towel between your skin and the bag.  Leave the ice on for 20 minutes, 2-3 times per day.  Keep all follow-up visits as told by your health care provider. This is important.  Do any exercises as told by your health care provider, physical therapist, or occupational therapist. SEEK MEDICAL CARE IF:   You have new symptoms.  Your pain is not controlled with medicines.  Your symptoms get worse.   This information is not intended to replace advice given to you by your health care provider. Make sure  you discuss any questions you have with your health care provider.   Document Released: 07/27/2000 Document Revised: 04/20/2015 Document Reviewed: 12/15/2014 Elsevier Interactive Patient Education [employer].

## 2016-01-04 NOTE — Telephone Encounter (Signed)
Pt call to say that his job is asking how he can lift. And is asking if he can have a note for missing work Midwife and tomorrow day .   [phone]

## 2016-01-18 ENCOUNTER — Ambulatory Visit: Payer: BLUE CROSS/BLUE SHIELD | Admitting: Adult Health

## 2016-01-19 ENCOUNTER — Ambulatory Visit: Payer: BLUE CROSS/BLUE SHIELD | Admitting: Adult Health

## 2016-01-20 ENCOUNTER — Encounter: Payer: Self-pay | Admitting: Adult Health

## 2016-01-20 ENCOUNTER — Ambulatory Visit (INDEPENDENT_AMBULATORY_CARE_PROVIDER_SITE_OTHER): Payer: BLUE CROSS/BLUE SHIELD | Admitting: Adult Health

## 2016-01-20 VITALS — BP 154/84 | HR 97 | Temp 98.1°F | Ht 68.0 in | Wt 226.8 lb

## 2016-01-20 DIAGNOSIS — G5603 Carpal tunnel syndrome, bilateral upper limbs: Secondary | ICD-10-CM | POA: Diagnosis not present

## 2016-01-20 NOTE — Progress Notes (Signed)
Pre visit review using our clinic review tool, if applicable. No additional management support is needed unless otherwise documented below in the visit note. 

## 2016-01-20 NOTE — Progress Notes (Signed)
Subjective:    Patient ID: Samuel Lyons, male    DOB: 1971-01-30, 45 y.o.   MRN: [id number]  HPI  45 year old male who  has a past medical history of Allergic rhinitis; Psoriasis; and Thrombocytosis ([place]). Presents to the office for three week follow up regarding possible carpal tunnel syndrome. He had a questionable Tinels sign and negative phalens. He was prescribed a two week course of prednisone 20 mg.   He reports the he feels " slightly better"  But still has numbness and tingling in both wrists and thumbs. He feels like his grip strength has returned  . Review of Systems  Constitutional: Negative.   Musculoskeletal: Negative.   Skin: Negative.   Neurological: Positive for numbness. Negative for dizziness, tremors and weakness.   Past Medical History  Diagnosis Date  . Allergic rhinitis   . Psoriasis   . Thrombocytosis [facility])     Social History   Social History  . Marital Status: Single    Spouse Name: N/A  . Number of Children: N/A  . Years of Education: N/A   Occupational History  . Engineer, technical sales   Social History Main Topics  . Smoking status: Never Smoker   . Smokeless tobacco: Not on file  . Alcohol Use: Yes  . Drug Use: No  . Sexual Activity: Not on file   Other Topics Concern  . Not on file   Social History Narrative   Married for 7 years- wife's name is Leeanne Mannan up in [place]   Works as a Psychologist, forensic for [employer]   3 children-twins fraternal (son & daughter age 76), son 63    Past Surgical History  Procedure Laterality Date  . Knee surgery Right     Family History  Problem Relation Age of Onset  . Ulcers Mother   . Hypertension Maternal Grandmother   . Stroke Maternal Grandfather   . Ovarian cancer Paternal Aunt     Allergies  Allergen Reactions  . Tetanus Toxoids Swelling    Lip swelling  . Percocet [Oxycodone-Acetaminophen] Nausea And Vomiting    Current Outpatient Prescriptions on  File Prior to Visit  Medication Sig Dispense Refill  . desloratadine (CLARINEX) 5 MG tablet Take 1 tablet (5 mg total) by mouth daily. 90 tablet 3  . fluticasone (FLONASE) 50 MCG/ACT nasal spray Place 2 sprays into both nostrils daily. 16 g 5  . ketoconazole (NIZORAL) 2 % shampoo Apply 1 application topically 2 (two) times a week. 120 mL 2  . losartan (COZAAR) 50 MG tablet Take 1 tablet (50 mg total) by mouth daily. 30 tablet 0  . predniSONE (DELTASONE) 20 MG tablet Take 1 tablet (20 mg total) by mouth daily with breakfast. 14 tablet 0  . triamcinolone ointment (KENALOG) 0.5 % Apply 1 application topically 2 (two) times daily. 30 g 0   No current facility-administered medications on file prior to visit.    BP 154/84 mmHg  Pulse 97  Temp(Src) 98.1 F (36.7 C) (Oral)  Ht 5\' 8"  (1.727 m)  Wt 226 lb 12.8 oz (102.876 kg)  BMI 34.49 kg/m2  SpO2 97%       Objective:   Physical Exam  Constitutional: He is oriented to person, place, and time. He appears well-developed and well-nourished. No distress.  Neurological: He is alert and oriented to person, place, and time. He has normal reflexes. He displays normal reflexes. No cranial nerve deficit. He exhibits normal  muscle tone. Coordination normal.  5/5 grip strength in bilateral hands - Tinels signs  + Phalen's test  Skin: Skin is warm and dry. No rash noted. He is not diaphoretic. No erythema. No pallor.  Psychiatric: He has a normal mood and affect. His behavior is normal. Judgment and thought content normal.  Nursing note and vitals reviewed.     Assessment & Plan:  1. Bilateral carpal tunnel syndrome - He can get wrist splints bigger than what we have in the office.  - Ambulatory referral to Sports Medicine - Consider referral to Neurology - Follow up as needed  Dorothyann Peng, NP

## 2016-01-20 NOTE — Patient Instructions (Signed)
Someone from sports medicine will call you to schedule the appointment  You can get wrist splints to see if they help with the discomfort.   Start eating better and and exercising

## 2016-02-08 ENCOUNTER — Ambulatory Visit: Payer: BLUE CROSS/BLUE SHIELD | Admitting: Family Medicine

## 2017-01-30 ENCOUNTER — Ambulatory Visit: Payer: BLUE CROSS/BLUE SHIELD | Admitting: Adult Health

## 2017-02-05 ENCOUNTER — Encounter: Payer: Self-pay | Admitting: Adult Health

## 2017-02-05 ENCOUNTER — Ambulatory Visit: Payer: BLUE CROSS/BLUE SHIELD | Admitting: Adult Health

## 2017-02-05 DIAGNOSIS — Z0289 Encounter for other administrative examinations: Secondary | ICD-10-CM

## 2017-02-05 NOTE — Progress Notes (Deleted)
Patient presents to clinic today to establish care. He is a pleasant 46 year old male who  has a past medical history of Allergic rhinitis; Psoriasis; and Thrombocytosis ([place]).   He is a former patient of Dr. Shawna Orleans who had a CPE in 2015    Acute Concerns: Establish Care   Chronic Issues: Essential Hypertension - Takes Cozzar 50mg  daily   Psoriasis - he is seen by Dermatology   Health Maintenance: Dental -- Vision -- Immunizations --   Past Medical History:  Diagnosis Date  . Allergic rhinitis   . Psoriasis   . Thrombocytosis ([place])     Past Surgical History:  Procedure Laterality Date  . KNEE SURGERY Right     Current Outpatient Prescriptions on File Prior to Visit  Medication Sig Dispense Refill  . desloratadine (CLARINEX) 5 MG tablet Take 1 tablet (5 mg total) by mouth daily. 90 tablet 3  . fluticasone (FLONASE) 50 MCG/ACT nasal spray Place 2 sprays into both nostrils daily. 16 g 5  . ketoconazole (NIZORAL) 2 % shampoo Apply 1 application topically 2 (two) times a week. 120 mL 2  . losartan (COZAAR) 50 MG tablet Take 1 tablet (50 mg total) by mouth daily. 30 tablet 0  . predniSONE (DELTASONE) 20 MG tablet Take 1 tablet (20 mg total) by mouth daily with breakfast. 14 tablet 0  . triamcinolone ointment (KENALOG) 0.5 % Apply 1 application topically 2 (two) times daily. 30 g 0   No current facility-administered medications on file prior to visit.     Allergies  Allergen Reactions  . Tetanus Toxoids Swelling    Lip swelling  . Percocet [Oxycodone-Acetaminophen] Nausea And Vomiting    Family History  Problem Relation Age of Onset  . Ulcers Mother   . Hypertension Maternal Grandmother   . Stroke Maternal Grandfather   . Ovarian cancer Paternal Aunt     Social History   Social History  . Marital status: Single    Spouse name: N/A  . Number of children: N/A  . Years of education: N/A   Occupational History  . Engineer, technical sales   Social  History Main Topics  . Smoking status: Never Smoker  . Smokeless tobacco: Not on file  . Alcohol use Yes  . Drug use: No  . Sexual activity: Not on file   Other Topics Concern  . Not on file   Social History Narrative   Married for 7 years- wife's name is Leeanne Mannan up in [place]   Works as a Psychologist, forensic for [employer]   3 children-twins fraternal (son & daughter age 63), son 37    Review of Systems  Constitutional: Negative.   HENT: Negative.   Eyes: Negative.   Respiratory: Negative.   Cardiovascular: Negative.   Genitourinary: Negative.   Musculoskeletal: Negative.   Skin: Negative.   Neurological: Negative.   Endo/Heme/Allergies: Negative.   Psychiatric/Behavioral: Negative.   All other systems reviewed and are negative.   There were no vitals taken for this visit.  Physical Exam  Constitutional: He is oriented to person, place, and time and well-developed, well-nourished, and in no distress. No distress.  HENT:  Head: Normocephalic and atraumatic.  Right Ear: External ear normal.  Left Ear: External ear normal.  Nose: Nose normal.  Mouth/Throat: Oropharynx is clear and moist. No oropharyngeal exudate.  Eyes: Conjunctivae and EOM are normal. Pupils are equal, round, and reactive to light. Right eye exhibits no  discharge. Left eye exhibits no discharge. No scleral icterus.  Neck: Normal range of motion. Neck supple. No JVD present. No tracheal deviation present. No thyromegaly present.  Cardiovascular: Normal rate, regular rhythm, normal heart sounds and intact distal pulses.  Exam reveals no gallop and no friction rub.   No murmur heard. Pulmonary/Chest: Effort normal and breath sounds normal. No stridor. No respiratory distress. He has no wheezes. He has no rales. He exhibits no tenderness.  Abdominal: Soft. Bowel sounds are normal. He exhibits no distension and no mass. There is no tenderness. There is no rebound and no guarding.  Musculoskeletal:  Normal range of motion. He exhibits no edema, tenderness or deformity.  Lymphadenopathy:    He has no cervical adenopathy.  Neurological: He is alert and oriented to person, place, and time. He displays normal reflexes. No cranial nerve deficit. He exhibits normal muscle tone. Gait normal. Coordination normal. GCS score is 15.  Skin: Skin is warm and dry. No rash noted. He is not diaphoretic. No erythema. No pallor.  Psychiatric: Mood, memory, affect and judgment normal.  Nursing note and vitals reviewed.   No results found for this or any previous visit (from the past 2160 hour(s)).  Assessment/Plan: No problem-specific Assessment & Plan notes found for this encounter.

## 2017-02-06 ENCOUNTER — Ambulatory Visit (INDEPENDENT_AMBULATORY_CARE_PROVIDER_SITE_OTHER): Payer: BLUE CROSS/BLUE SHIELD | Admitting: Adult Health

## 2017-02-06 ENCOUNTER — Encounter: Payer: Self-pay | Admitting: Adult Health

## 2017-02-06 VITALS — BP 138/78 | Temp 98.7°F | Ht 68.0 in | Wt 222.7 lb

## 2017-02-06 DIAGNOSIS — I10 Essential (primary) hypertension: Secondary | ICD-10-CM | POA: Diagnosis not present

## 2017-02-06 DIAGNOSIS — L409 Psoriasis, unspecified: Secondary | ICD-10-CM

## 2017-02-06 DIAGNOSIS — Z0000 Encounter for general adult medical examination without abnormal findings: Secondary | ICD-10-CM

## 2017-02-06 LAB — LIPID PANEL
CHOL/HDL RATIO: 6
Cholesterol: 252 mg/dL — ABNORMAL HIGH (ref 0–200)
HDL: 40.1 mg/dL (ref >39.00)
LDL Cholesterol: 173 mg/dL — ABNORMAL HIGH (ref 0–99)
NONHDL: 212.37
TRIGLYCERIDES: 199 mg/dL — AB (ref 0.0–149.0)
VLDL: 39.8 mg/dL (ref 0.0–40.0)

## 2017-02-06 LAB — HEPATIC FUNCTION PANEL
ALBUMIN: 4.5 g/dL (ref 3.5–5.2)
ALT: 29 U/L (ref 0–53)
AST: 21 U/L (ref 0–37)
Alkaline Phosphatase: 145 U/L — ABNORMAL HIGH (ref 39–117)
BILIRUBIN DIRECT: 0.1 mg/dL (ref 0.0–0.3)
Total Bilirubin: 0.5 mg/dL (ref 0.2–1.2)
Total Protein: 7 g/dL (ref 6.0–8.3)

## 2017-02-06 LAB — CBC WITH DIFFERENTIAL/PLATELET
Basophils Absolute: 0 10*3/uL (ref 0.0–0.1)
Basophils Relative: 0.8 % (ref 0.0–3.0)
EOS PCT: 3.8 % (ref 0.0–5.0)
Eosinophils Absolute: 0.2 10*3/uL (ref 0.0–0.7)
HEMATOCRIT: 45.1 % (ref 39.0–52.0)
Hemoglobin: 15.1 g/dL (ref 13.0–17.0)
LYMPHS ABS: 1.5 10*3/uL (ref 0.7–4.0)
LYMPHS PCT: 33.8 % (ref 12.0–46.0)
MCHC: 33.4 g/dL (ref 30.0–36.0)
MCV: 79.5 fl (ref 78.0–100.0)
MONOS PCT: 6.8 % (ref 3.0–12.0)
Monocytes Absolute: 0.3 10*3/uL (ref 0.1–1.0)
NEUTROS ABS: 2.5 10*3/uL (ref 1.4–7.7)
NEUTROS PCT: 54.8 % (ref 43.0–77.0)
PLATELETS: 234 10*3/uL (ref 150.0–400.0)
RBC: 5.67 Mil/uL (ref 4.22–5.81)
RDW: 14.4 % (ref 11.5–15.5)
WBC: 4.6 10*3/uL (ref 4.0–10.5)

## 2017-02-06 LAB — BASIC METABOLIC PANEL
BUN: 14 mg/dL (ref 6–23)
CHLORIDE: 103 meq/L (ref 96–112)
CO2: 27 mEq/L (ref 19–32)
Calcium: 9.8 mg/dL (ref 8.4–10.5)
Creatinine, Ser: 1.12 mg/dL (ref 0.40–1.50)
GFR: 91.8 mL/min (ref >60.00)
GLUCOSE: 102 mg/dL — AB (ref 70–99)
Potassium: 4.2 mEq/L (ref 3.5–5.1)
SODIUM: 137 meq/L (ref 135–145)

## 2017-02-06 LAB — TSH: TSH: 1.89 u[IU]/mL (ref 0.35–4.50)

## 2017-02-06 LAB — HEMOGLOBIN A1C: Hgb A1c MFr Bld: 6 % (ref 4.6–6.5)

## 2017-02-06 MED ORDER — DESLORATADINE 5 MG PO TABS: 5.0000 mg | ORAL_TABLET | Freq: Every day | ORAL | 3 refills | Status: AC

## 2017-02-06 MED ORDER — LOSARTAN POTASSIUM 50 MG PO TABS: 50.0000 mg | ORAL_TABLET | Freq: Every day | ORAL | 3 refills | Status: DC

## 2017-02-06 MED ORDER — TRIAMCINOLONE ACETONIDE 0.5 % EX OINT: 1.0000 "application " | TOPICAL_OINTMENT | Freq: Two times a day (BID) | CUTANEOUS | 0 refills | Status: DC

## 2017-02-06 NOTE — Progress Notes (Signed)
Subjective:    Patient ID: Samuel Lyons, male    DOB: April 29, 1971, 46 y.o.   MRN: [id number]  HPI  Patient presents for yearly preventative medicine examination.He is a pleasant 46 year old male who  has a past medical history of Allergic rhinitis; Essential hypertension; Psoriasis; and Thrombocytosis ([place]).  All immunizations and health maintenance protocols were reviewed with the patient and needed orders were placed.  Appropriate screening laboratory values were ordered for the patient including screening of hyperlipidemia, renal function and hepatic function. If indicated by BPH, a PSA was ordered.  Medication reconciliation,  past medical history, social history, problem list and allergies were reviewed in detail with the patient  Goals were established with regard to weight loss, exercise, and  diet in compliance with medications. He is eating healthier but is not exercising on a regular basis.   Wt Readings from Last 3 Encounters:  02/06/17 222 lb 11.2 oz (101 kg)  01/20/16 226 lb 12.8 oz (102.9 kg)  01/04/16 223 lb (101.2 kg)    He takes Cozaar 50mg  daily for blood pressure control   BP Readings from Last 3 Encounters:  02/06/17 138/78  01/20/16 (!) 154/84  01/04/16 (!) 142/82   He uses Ketokonazole shampoo for psoriasis.   He also uses Claritin and Flonase for seasonal allergies    Review of Systems  Constitutional: Negative.   HENT: Negative.   Eyes: Negative.   Respiratory: Negative.   Cardiovascular: Negative.   Gastrointestinal: Negative.   Endocrine: Negative.   Genitourinary: Negative.   Musculoskeletal: Negative.   Skin: Negative.   Allergic/Immunologic: Negative.   Neurological: Negative.   Hematological: Negative.   Psychiatric/Behavioral: Negative.   All other systems reviewed and are negative.  Past Medical History:  Diagnosis Date  . Allergic rhinitis   . Essential hypertension   . Psoriasis   . Thrombocytosis [facility])     Social  History   Social History  . Marital status: Single    Spouse name: N/A  . Number of children: N/A  . Years of education: N/A   Occupational History  . Engineer, technical sales   Social History Main Topics  . Smoking status: Never Smoker  . Smokeless tobacco: Never Used  . Alcohol use Yes  . Drug use: No  . Sexual activity: Not on file   Other Topics Concern  . Not on file   Social History Narrative   Married for 7 years- wife's name is Leeanne Mannan up in [place]   Works as a Psychologist, forensic for [employer]   3 children-twins fraternal (son & daughter age 75), son 19    Past Surgical History:  Procedure Laterality Date  . KNEE SURGERY Right     Family History  Problem Relation Age of Onset  . Ulcers Mother   . Hypertension Maternal Grandmother   . Stroke Maternal Grandfather   . Ovarian cancer Paternal Aunt     Allergies  Allergen Reactions  . Tetanus Toxoids Swelling    Lip swelling  . Percocet [Oxycodone-Acetaminophen] Nausea And Vomiting    Current Outpatient Prescriptions on File Prior to Visit  Medication Sig Dispense Refill  . desloratadine (CLARINEX) 5 MG tablet Take 1 tablet (5 mg total) by mouth daily. 90 tablet 3  . triamcinolone ointment (KENALOG) 0.5 % Apply 1 application topically 2 (two) times daily. 30 g 0  . fluticasone (FLONASE) 50 MCG/ACT nasal spray Place 2 sprays into both nostrils daily.  16 g 5   No current facility-administered medications on file prior to visit.     BP 138/78 (BP Location: Left Arm, Patient Position: Sitting, Cuff Size: Large)   Temp 98.7 F (37.1 C) (Oral)   Ht 5\' 8"  (1.727 m)   Wt 222 lb 11.2 oz (101 kg)   BMI 33.86 kg/m        Objective:   Physical Exam  Constitutional: He is oriented to person, place, and time. He appears well-developed and well-nourished. No distress.  Obese   HENT:  Head: Normocephalic and atraumatic.  Right Ear: External ear normal.  Left Ear: External ear  normal.  Nose: Nose normal.  Mouth/Throat: Oropharynx is clear and moist. No oropharyngeal exudate.  Eyes: Conjunctivae and EOM are normal. Pupils are equal, round, and reactive to light. Right eye exhibits no discharge. Left eye exhibits no discharge. No scleral icterus.  Neck: Normal range of motion. Neck supple. No JVD present. No tracheal deviation present. No thyromegaly present.  Cardiovascular: Normal rate, regular rhythm, normal heart sounds and intact distal pulses.  Exam reveals no gallop and no friction rub.   No murmur heard. Pulmonary/Chest: Effort normal and breath sounds normal. No stridor. No respiratory distress. He has no wheezes. He has no rales. He exhibits no tenderness.  Abdominal: Soft. Bowel sounds are normal. He exhibits no distension and no mass. There is no tenderness. There is no rebound and no guarding.  Musculoskeletal: Normal range of motion. He exhibits no edema, tenderness or deformity.  Lymphadenopathy:    He has no cervical adenopathy.  Neurological: He is alert and oriented to person, place, and time. He has normal reflexes. He displays normal reflexes. No cranial nerve deficit. He exhibits normal muscle tone. Coordination normal.  Skin: Skin is warm and dry. No rash noted. He is not diaphoretic. No erythema. No pallor.  Psychiatric: He has a normal mood and affect. His behavior is normal. Judgment and thought content normal.  Nursing note and vitals reviewed.     Assessment & Plan:  1. Routine general medical examination at a health care facility - Needs to start exercising  - Follow up in one year or sooner if needed - Basic metabolic panel - CBC with Differential/Platelet - Hemoglobin A1c - Hepatic function panel - Lipid panel - TSH  2. Essential hypertension - Better controlled today in the office  - Basic metabolic panel - CBC with Differential/Platelet - Hemoglobin A1c - Hepatic function panel - Lipid panel - TSH - losartan (COZAAR) 50 MG  tablet; Take 1 tablet (50 mg total) by mouth daily.  Dispense: 90 tablet; Refill: 3  Dorothyann Peng, NP

## 2017-02-06 NOTE — Addendum Note (Signed)
Addended by: Sandria Bales B on: 02/06/2017 08:39 AM   Modules accepted: Orders

## 2017-02-06 NOTE — Patient Instructions (Signed)
It was great seeing you today   I will follow up with you regarding your blood work.   Your biggest thing is getting into a regular exercise routine   Follow up in one year or sooner if needed  Health Maintenance, Male A healthy lifestyle and preventative care can promote health and wellness.  Maintain regular health, dental, and eye exams.  Eat a healthy diet. Foods like vegetables, fruits, whole grains, low-fat dairy products, and lean protein foods contain the nutrients you need and are low in calories. Decrease your intake of foods high in solid fats, added sugars, and salt. Get information about a proper diet from your health care provider, if necessary.  Regular physical exercise is one of the most important things you can do for your health. Most adults should get at least 150 minutes of moderate-intensity exercise (any activity that increases your heart rate and causes you to sweat) each week. In addition, most adults need muscle-strengthening exercises on 2 or more days a week.   Maintain a healthy weight. The body mass index (BMI) is a screening tool to identify possible weight problems. It provides an estimate of body fat based on height and weight. Your health care provider can find your BMI and can help you achieve or maintain a healthy weight. For males 20 years and older:  A BMI below 18.5 is considered underweight.  A BMI of 18.5 to 24.9 is normal.  A BMI of 25 to 29.9 is considered overweight.  A BMI of 30 and above is considered obese.  Maintain normal blood lipids and cholesterol by exercising and minimizing your intake of saturated fat. Eat a balanced diet with plenty of fruits and vegetables. Blood tests for lipids and cholesterol should begin at age 74 and be repeated every 5 years. If your lipid or cholesterol levels are high, you are over age 68, or you are at high risk for heart disease, you may need your cholesterol levels checked more frequently.Ongoing high  lipid and cholesterol levels should be treated with medicines if diet and exercise are not working.  If you smoke, find out from your health care provider how to quit. If you do not use tobacco, do not start.  Lung cancer screening is recommended for adults aged 64-80 years who are at high risk for developing lung cancer because of a history of smoking. A yearly low-dose CT scan of the lungs is recommended for people who have at least a 30-pack-year history of smoking and are current smokers or have quit within the past 15 years. A pack year of smoking is smoking an average of 1 pack of cigarettes a day for 1 year (for example, a 30-pack-year history of smoking could mean smoking 1 pack a day for 30 years or 2 packs a day for 15 years). Yearly screening should continue until the smoker has stopped smoking for at least 15 years. Yearly screening should be stopped for people who develop a health problem that would prevent them from having lung cancer treatment.  If you choose to drink alcohol, do not have more than 2 drinks per day. One drink is considered to be 12 oz (360 mL) of beer, 5 oz (150 mL) of wine, or 1.5 oz (45 mL) of liquor.  Avoid the use of street drugs. Do not share needles with anyone. Ask for help if you need support or instructions about stopping the use of drugs.  High blood pressure causes heart disease and increases the  risk of stroke. High blood pressure is more likely to develop in:  People who have blood pressure in the end of the normal range (100-139/85-89 mm Hg).  People who are overweight or obese.  People who are African American.  If you are 81-61 years of age, have your blood pressure checked every 3-5 years. If you are 36 years of age or older, have your blood pressure checked every year. You should have your blood pressure measured twice--once when you are at a hospital or clinic, and once when you are not at a hospital or clinic. Record the average of the two  measurements. To check your blood pressure when you are not at a hospital or clinic, you can use:  An automated blood pressure machine at a pharmacy.  A home blood pressure monitor.  If you are 53-40 years old, ask your health care provider if you should take aspirin to prevent heart disease.  Diabetes screening involves taking a blood sample to check your fasting blood sugar level. This should be done once every 3 years after age 26 if you are at a normal weight and without risk factors for diabetes. Testing should be considered at a younger age or be carried out more frequently if you are overweight and have at least 1 risk factor for diabetes.  Colorectal cancer can be detected and often prevented. Most routine colorectal cancer screening begins at the age of 11 and continues through age 79. However, your health care provider may recommend screening at an earlier age if you have risk factors for colon cancer. On a yearly basis, your health care provider may provide home test kits to check for hidden blood in the stool. A small camera at the end of a tube may be used to directly examine the colon (sigmoidoscopy or colonoscopy) to detect the earliest forms of colorectal cancer. Talk to your health care provider about this at age 53 when routine screening begins. A direct exam of the colon should be repeated every 5-10 years through age 46, unless early forms of precancerous polyps or small growths are found.  People who are at an increased risk for hepatitis B should be screened for this virus. You are considered at high risk for hepatitis B if:  You were born in a country where hepatitis B occurs often. Talk with your health care provider about which countries are considered high risk.  Your parents were born in a high-risk country and you have not received a shot to protect against hepatitis B (hepatitis B vaccine).  You have HIV or AIDS.  You use needles to inject street drugs.  You live  with, or have sex with, someone who has hepatitis B.  You are a man who has sex with other men (MSM).  You get hemodialysis treatment.  You take certain medicines for conditions like cancer, organ transplantation, and autoimmune conditions.  Hepatitis C blood testing is recommended for all people born from [birth year] through 1963 and any individual with known risk factors for hepatitis C.  Healthy men should no longer receive prostate-specific antigen (PSA) blood tests as part of routine cancer screening. Talk to your health care provider about prostate cancer screening.  Testicular cancer screening is not recommended for adolescents or adult males who have no symptoms. Screening includes self-exam, a health care provider exam, and other screening tests. Consult with your health care provider about any symptoms you have or any concerns you have about testicular cancer.  Practice safe  sex. Use condoms and avoid high-risk sexual practices to reduce the spread of sexually transmitted infections (STIs).  You should be screened for STIs, including gonorrhea and chlamydia if:  You are sexually active and are younger than 24 years.  You are older than 24 years, and your health care provider tells you that you are at risk for this type of infection.  Your sexual activity has changed since you were last screened, and you are at an increased risk for chlamydia or gonorrhea. Ask your health care provider if you are at risk.  If you are at risk of being infected with HIV, it is recommended that you take a prescription medicine daily to prevent HIV infection. This is called pre-exposure prophylaxis (PrEP). You are considered at risk if:  You are a man who has sex with other men (MSM).  You are a heterosexual man who is sexually active with multiple partners.  You take drugs by injection.  You are sexually active with a partner who has HIV.  Talk with your health care provider about whether you are at  high risk of being infected with HIV. If you choose to begin PrEP, you should first be tested for HIV. You should then be tested every 3 months for as long as you are taking PrEP.  Use sunscreen. Apply sunscreen liberally and repeatedly throughout the day. You should seek shade when your shadow is shorter than you. Protect yourself by wearing long sleeves, pants, a wide-brimmed hat, and sunglasses year round whenever you are outdoors.  Tell your health care provider of new moles or changes in moles, especially if there is a change in shape or color. Also, tell your health care provider if a mole is larger than the size of a pencil eraser.  A one-time screening for abdominal aortic aneurysm (AAA) and surgical repair of large AAAs by ultrasound is recommended for men aged 25-75 years who are current or former smokers.  Stay current with your vaccines (immunizations).   This information is not intended to replace advice given to you by your health care provider. Make sure you discuss any questions you have with your health care provider.   Document Released: 01/26/2008 Document Revised: 08/20/2014 Document Reviewed: 12/25/2010 Elsevier Interactive Patient Education [employer].

## 2017-05-03 ENCOUNTER — Encounter: Payer: Self-pay | Admitting: Adult Health

## 2017-09-08 ENCOUNTER — Other Ambulatory Visit: Payer: Self-pay

## 2017-09-08 ENCOUNTER — Emergency Department (HOSPITAL_COMMUNITY)
Admission: EM | Admit: 2017-09-08 | Discharge: 2017-09-09 | Disposition: A | Discharge status: Home / Self Care | Payer: BLUE CROSS/BLUE SHIELD | Attending: Emergency Medicine | Admitting: Emergency Medicine

## 2017-09-08 ENCOUNTER — Encounter (HOSPITAL_COMMUNITY): Payer: Self-pay | Admitting: Emergency Medicine

## 2017-09-08 DIAGNOSIS — R220 Localized swelling, mass and lump, head: Secondary | ICD-10-CM

## 2017-09-08 DIAGNOSIS — I10 Essential (primary) hypertension: Secondary | ICD-10-CM | POA: Diagnosis not present

## 2017-09-08 MED ORDER — HYDROCODONE-ACETAMINOPHEN 5-325 MG PO TABS: 1.0000 | ORAL_TABLET | ORAL | 0 refills | Status: DC | PRN

## 2017-09-08 MED ORDER — AMOXICILLIN 500 MG PO CAPS: 1000.0000 mg | ORAL_CAPSULE | Freq: Two times a day (BID) | ORAL | 0 refills | Status: DC

## 2017-09-08 MED ORDER — IBUPROFEN 200 MG PO TABS
600.0000 mg | ORAL_TABLET | Freq: Once | ORAL | Status: AC
  Administered 2017-09-09: 600 mg via ORAL
  Filled 2017-09-08: qty 3

## 2017-09-08 MED ORDER — HYDROCODONE-ACETAMINOPHEN 5-325 MG PO TABS
1.0000 | ORAL_TABLET | Freq: Once | ORAL | Status: AC
  Administered 2017-09-09: 1 via ORAL
  Filled 2017-09-08: qty 1

## 2017-09-08 MED ORDER — IBUPROFEN 600 MG PO TABS: 600.0000 mg | ORAL_TABLET | Freq: Four times a day (QID) | ORAL | 0 refills | Status: DC | PRN

## 2017-09-08 MED ORDER — AMOXICILLIN 500 MG PO CAPS
1000.0000 mg | ORAL_CAPSULE | Freq: Once | ORAL | Status: AC
  Administered 2017-09-09: 1000 mg via ORAL
  Filled 2017-09-08: qty 2

## 2017-09-08 NOTE — Discharge Instructions (Signed)
Return here with any fever, worsening pain or swelling or new symptom of concern.

## 2017-09-08 NOTE — ED Triage Notes (Signed)
Pt reports having swelling to left cheek below left eye to above mouth. Pt denies any difficulty breathing. Pt states that swelling started on Friday. Pt reports taking one tylenol PM an hour ago and 2 tablets of benadryl 4 hours ago.

## 2017-09-08 NOTE — ED Provider Notes (Signed)
[facility] DEPT Provider Note   CSN: [id number] Arrival date & time: 09/08/17  2044     History   Chief Complaint Chief Complaint  Patient presents with  . Facial Swelling    HPI Samuel Lyons is a 47 y.o. male.  Patient with history of psoriasis, HTN presents with painful right sided facial swelling developing over the last 2-3 days. No fever. He denies dental pain, sore throat or significant sinus congestion or pressure. No nausea. Symptoms are limited to right anterior cheek with pain extending to submental area.    The history is provided by the patient. No language interpreter was used.    Past Medical History:  Diagnosis Date  . Allergic rhinitis   . Essential hypertension   . Psoriasis   . Thrombocytosis [facility])     Patient Active Problem List   Diagnosis Date Noted  . Allergic rhinitis 11/20/2013  . Hypertension 11/20/2013  . Preventative health care 09/02/2012  . Psoriasis 09/02/2012  . Thrombocytosis ([place]) 09/02/2012    Past Surgical History:  Procedure Laterality Date  . KNEE SURGERY Right        Home Medications    Prior to Admission medications   Medication Sig Start Date End Date Taking? Authorizing Provider  desloratadine (CLARINEX) 5 MG tablet Take 1 tablet (5 mg total) by mouth daily. 02/06/17   Nafziger, Tommi Rumps, NP  fluticasone (FLONASE) 50 MCG/ACT nasal spray Place 2 sprays into both nostrils daily. 11/23/15 11/22/16  Nafziger, Tommi Rumps, NP  losartan (COZAAR) 50 MG tablet Take 1 tablet (50 mg total) by mouth daily. 02/06/17   Nafziger, Tommi Rumps, NP  triamcinolone ointment (KENALOG) 0.5 % Apply 1 application topically 2 (two) times daily. 02/06/17   Dorothyann Peng, NP    Family History Family History  Problem Relation Age of Onset  . Ulcers Mother   . Hypertension Maternal Grandmother   . Stroke Maternal Grandfather   . Ovarian cancer Paternal Aunt     Social History Social History   Tobacco Use  . Smoking  status: Never Smoker  . Smokeless tobacco: Never Used  Substance Use Topics  . Alcohol use: Yes  . Drug use: No     Allergies   Tetanus toxoids and Percocet [oxycodone-acetaminophen]   Review of Systems Review of Systems  HENT: Positive for facial swelling. Negative for congestion, dental problem, sinus pressure, sore throat and trouble swallowing.   Gastrointestinal: Negative for nausea and vomiting.  Skin: Negative for color change.  Neurological: Negative for headaches.     Physical Exam Updated Vital Signs BP (!) 171/110 (BP Location: Left Arm)   Pulse (!) 104   Temp 98.6 F (37 C) (Oral)   Resp 20   Ht 5' 7.5" (1.715 m)   Wt 109.8 kg (242 lb)   SpO2 95%   BMI 37.34 kg/m   Physical Exam  Constitutional: He is oriented to person, place, and time. He appears well-developed and well-nourished.  HENT:  Nose: No mucosal edema. Right sinus exhibits no maxillary sinus tenderness and no frontal sinus tenderness. Left sinus exhibits no maxillary sinus tenderness and no frontal sinus tenderness.  Mouth/Throat: Oropharynx is clear and moist.  Swelling to left anterior cheek with mild redness. No palpable or discrete cutaneous abscess. Generally good dentition without visualized apical abscess or dental tenderness. Left TM is unremarkable.   Eyes: Conjunctivae are normal. Pupils are equal, round, and reactive to light.  Full, painfree ROM bilaterally  Neck: Normal range of motion.  Pulmonary/Chest: Effort normal.  Musculoskeletal: Normal range of motion.  Lymphadenopathy:       Head (left side): Submental adenopathy present.  Neurological: He is alert and oriented to person, place, and time.  Skin: Skin is warm and dry.  Psychiatric: He has a normal mood and affect.     ED Treatments / Results  Labs (all labs ordered are listed, but only abnormal results are displayed) Labs Reviewed - No data to display  EKG  EKG Interpretation None       Radiology No results  found.  Procedures Procedures (including critical care time)  Medications Ordered in ED Medications  amoxicillin (AMOXIL) capsule 1,000 mg (not administered)  HYDROcodone-acetaminophen (NORCO/VICODIN) 5-325 MG per tablet 1 tablet (not administered)  ibuprofen (ADVIL,MOTRIN) tablet 600 mg (not administered)     Initial Impression / Assessment and Plan / ED Course  I have reviewed the triage vital signs and the nursing notes.  Pertinent labs & imaging results that were available during my care of the patient were reviewed by me and considered in my medical decision making (see chart for details).     Patient presents with left facial swelling that is painful. No fever or dental issues. Doubt localized abscess. No evidence cellulitis, orbital or periorbital infection. Symptoms likely dental. Will start on amoxicillin and patient will go to PCP and/or dentist for recheck this week.   Final Clinical Impressions(s) / ED Diagnoses   Final diagnoses:  None   1. Left facial swelling  ED Discharge Orders    None       Charlann Lange, Hershal Coria 09/08/17 2354    Daleen Bo, MD 09/09/17 0100

## 2017-09-11 ENCOUNTER — Encounter: Payer: Self-pay | Admitting: Adult Health

## 2017-09-11 ENCOUNTER — Other Ambulatory Visit: Payer: Self-pay

## 2017-09-11 ENCOUNTER — Ambulatory Visit (INDEPENDENT_AMBULATORY_CARE_PROVIDER_SITE_OTHER): Payer: BLUE CROSS/BLUE SHIELD | Admitting: Adult Health

## 2017-09-11 VITALS — BP 160/120 | HR 115 | Temp 98.6°F | Ht 67.5 in | Wt 229.6 lb

## 2017-09-11 DIAGNOSIS — L409 Psoriasis, unspecified: Secondary | ICD-10-CM

## 2017-09-11 DIAGNOSIS — I10 Essential (primary) hypertension: Secondary | ICD-10-CM

## 2017-09-11 DIAGNOSIS — R220 Localized swelling, mass and lump, head: Secondary | ICD-10-CM | POA: Diagnosis not present

## 2017-09-11 MED ORDER — TRIAMCINOLONE ACETONIDE 0.5 % EX OINT: 1.0000 "application " | TOPICAL_OINTMENT | Freq: Two times a day (BID) | CUTANEOUS | 0 refills | Status: DC

## 2017-09-11 MED ORDER — LOSARTAN POTASSIUM 50 MG PO TABS: 50.0000 mg | ORAL_TABLET | Freq: Every day | ORAL | 1 refills | Status: DC

## 2017-09-11 NOTE — Telephone Encounter (Signed)
Patient was inform of medication sent  electronically.

## 2017-09-11 NOTE — Patient Instructions (Addendum)
Continue with antibiotics  I have sent in your prescription for Cozaar, please follow up with me in 2 weeks for blood pressure check

## 2017-09-11 NOTE — Progress Notes (Signed)
Subjective:    Patient ID: Samuel Lyons, male    DOB: [birth year], 47 y.o.   MRN: [id number]  HPI 47 year old male who  has a past medical history of Allergic rhinitis, Essential hypertension, Psoriasis, and Thrombocytosis ([place]).  He presents to the office today for follow up regarding recent ER visit.  He was seen 3 days ago in the ER for left-sided facial swelling that had developed over the previous 2-3 days before being seen.  He did not endorse a fever, denies any dental pain, sore throat or significant sinus congestion or pressure.  The ER note note there was no discrete cutaneous abscess and they did not visualize it apical abscess or dental tenderness.  He was started on worse of amoxicillin and was advised to follow-up with PCP and dentist for recheck.  Concern for elevated blood pressure in the emergency room, His blood pressure was 171/110.  Today in the office he reports that he is been taking the antibiotic as directed and he feels as though his facial swelling is starting to resolve.  He continues to complain of no pain across left cheek or any dental pain.  He is following up with his dentist tomorrow.  As for his blood pressure, he reports that he has been out of his medication for approximately 1 month.  He has failed to call to get a refill.  He denies any headaches, blurred vision, dizziness, or lightheadedness.  Wt Readings from Last 3 Encounters:  09/11/17 229 lb 9.6 oz (104.1 kg)  09/08/17 242 lb (109.8 kg)  02/06/17 222 lb 11.2 oz (101 kg)   Review of Systems See HPI   Past Medical History:  Diagnosis Date  . Allergic rhinitis   . Essential hypertension   . Psoriasis   . Thrombocytosis ([place])     Social History   Socioeconomic History  . Marital status: Single    Spouse name: Not on file  . Number of children: Not on file  . Years of education: Not on file  . Highest education level: Not on file  Social Needs  . Financial resource strain: Not on  file  . Food insecurity - worry: Not on file  . Food insecurity - inability: Not on file  . Transportation needs - medical: Not on file  . Transportation needs - non-medical: Not on file  Occupational History  . Occupation: Public relations account executive: [employer]  Tobacco Use  . Smoking status: Never Smoker  . Smokeless tobacco: Never Used  Substance and Sexual Activity  . Alcohol use: Yes  . Drug use: No  . Sexual activity: Not on file  Other Topics Concern  . Not on file  Social History Narrative   Married for 7 years- wife's name is Leeanne Mannan up in [place]   Works as a Psychologist, forensic for [employer]   3 children-twins fraternal (son & daughter age 73), son 34    Past Surgical History:  Procedure Laterality Date  . KNEE SURGERY Right     Family History  Problem Relation Age of Onset  . Ulcers Mother   . Hypertension Maternal Grandmother   . Stroke Maternal Grandfather   . Ovarian cancer Paternal Aunt     Allergies  Allergen Reactions  . Tetanus Toxoids Swelling    Lip swelling  . Percocet [Oxycodone-Acetaminophen] Nausea And Vomiting    Current Outpatient Medications on File Prior to Visit  Medication Sig  Dispense Refill  . amoxicillin (AMOXIL) 500 MG capsule Take 2 capsules (1,000 mg total) by mouth 2 (two) times daily. 40 capsule 0  . desloratadine (CLARINEX) 5 MG tablet Take 1 tablet (5 mg total) by mouth daily. 90 tablet 3  . HYDROcodone-acetaminophen (NORCO/VICODIN) 5-325 MG tablet Take 1 tablet by mouth every 4 (four) hours as needed. 4 tablet 0  . ibuprofen (ADVIL,MOTRIN) 600 MG tablet Take 1 tablet (600 mg total) by mouth every 6 (six) hours as needed. 30 tablet 0  . triamcinolone ointment (KENALOG) 0.5 % Apply 1 application topically 2 (two) times daily. 30 g 0  . fluticasone (FLONASE) 50 MCG/ACT nasal spray Place 2 sprays into both nostrils daily. 16 g 5   No current facility-administered medications on file prior to visit.      BP (!) 160/120 (BP Location: Right Arm, Patient Position: Sitting)   Pulse (!) 115   Temp 98.6 F (37 C) (Oral)   Ht 5' 7.5" (1.715 m)   Wt 229 lb 9.6 oz (104.1 kg)   SpO2 96%   BMI 35.43 kg/m        Objective:   Physical Exam  Constitutional: He is oriented to person, place, and time. He appears well-developed and well-nourished. No distress.  HENT:  Mouth/Throat: Uvula is midline, oropharynx is clear and moist and mucous membranes are normal. No oral lesions. Normal dentition. No dental abscesses or dental caries.  Continues to have just trace swelling under left eye, no redness, warmth, or drainage noted.  No palpable abscess  Cardiovascular: Normal rate, regular rhythm, normal heart sounds and intact distal pulses. Exam reveals no gallop and no friction rub.  No murmur heard. Pulmonary/Chest: Effort normal and breath sounds normal. No respiratory distress. He has no wheezes. He has no rales. He exhibits no tenderness.  Musculoskeletal: Normal range of motion. He exhibits no edema, tenderness or deformity.  Neurological: He is alert and oriented to person, place, and time.  Skin: Skin is warm and dry. No rash noted. He is not diaphoretic. No erythema. No pallor.  Psychiatric: He has a normal mood and affect. His behavior is normal. Judgment and thought content normal.  Nursing note and vitals reviewed.     Assessment & Plan:  1. Essential hypertension - losartan (COZAAR) 50 MG tablet; Take 1 tablet (50 mg total) by mouth daily.  Dispense: 30 tablet; Refill: 1 -Follow  up in 2 weeks for recheck 2. Left facial swelling -Continue with antibiotics as directed -Follow up as needed   Dorothyann Peng, NP

## 2017-09-25 ENCOUNTER — Ambulatory Visit: Payer: BLUE CROSS/BLUE SHIELD | Admitting: Adult Health

## 2017-09-30 ENCOUNTER — Ambulatory Visit (INDEPENDENT_AMBULATORY_CARE_PROVIDER_SITE_OTHER): Payer: BLUE CROSS/BLUE SHIELD | Admitting: Family Medicine

## 2017-09-30 ENCOUNTER — Encounter: Payer: Self-pay | Admitting: Family Medicine

## 2017-09-30 VITALS — BP 136/90 | HR 97 | Temp 98.5°F | Resp 12 | Ht 67.5 in | Wt 229.2 lb

## 2017-09-30 DIAGNOSIS — I10 Essential (primary) hypertension: Secondary | ICD-10-CM | POA: Diagnosis not present

## 2017-09-30 LAB — BASIC METABOLIC PANEL
BUN: 9 mg/dL (ref 6–23)
CO2: 28 mEq/L (ref 19–32)
CREATININE: 1.11 mg/dL (ref 0.40–1.50)
Calcium: 9.3 mg/dL (ref 8.4–10.5)
Chloride: 102 mEq/L (ref 96–112)
GFR: 92.48 mL/min (ref >60.00)
GLUCOSE: 99 mg/dL (ref 70–99)
Potassium: 4.5 mEq/L (ref 3.5–5.1)
Sodium: 139 mEq/L (ref 135–145)

## 2017-09-30 MED ORDER — LOSARTAN POTASSIUM 50 MG PO TABS
50.0000 mg | ORAL_TABLET | Freq: Every day | ORAL | 1 refills | Status: DC
Start: 2017-09-30 — End: 2017-10-31

## 2017-09-30 NOTE — Assessment & Plan Note (Signed)
Slightly above goal. No changes in current management for now. Recommend checking BP at home. DASH-low salt diet recommended. Eye exam recommended annually. F/U in 6 months with PCP, before if needed.

## 2017-09-30 NOTE — Patient Instructions (Signed)
A few things to remember from today's visit:   Essential hypertension - Plan: Basic metabolic panel, losartan (COZAAR) 50 MG tablet   DASH Eating Plan DASH stands for "Dietary Approaches to Stop Hypertension." The DASH eating plan is a healthy eating plan that has been shown to reduce high blood pressure (hypertension). It may also reduce your risk for type 2 diabetes, heart disease, and stroke. The DASH eating plan may also help with weight loss. What are tips for following this plan? General guidelines  Avoid eating more than 2,300 mg (milligrams) of salt (sodium) a day. If you have hypertension, you may need to reduce your sodium intake to 1,500 mg a day.  Limit alcohol intake to no more than 1 drink a day for nonpregnant women and 2 drinks a day for men. One drink equals 12 oz of beer, 5 oz of wine, or 1 oz of hard liquor.  Work with your health care provider to maintain a healthy body weight or to lose weight. Ask what an ideal weight is for you.  Get at least 30 minutes of exercise that causes your heart to beat faster (aerobic exercise) most days of the week. Activities may include walking, swimming, or biking.  Work with your health care provider or diet and nutrition specialist (dietitian) to adjust your eating plan to your individual calorie needs. Reading food labels  Check food labels for the amount of sodium per serving. Choose foods with less than 5 percent of the Daily Value of sodium. Generally, foods with less than 300 mg of sodium per serving fit into this eating plan.  To find whole grains, look for the word "whole" as the first word in the ingredient list. Shopping  Buy products labeled as "low-sodium" or "no salt added."  Buy fresh foods. Avoid canned foods and premade or frozen meals. Cooking  Avoid adding salt when cooking. Use salt-free seasonings or herbs instead of table salt or sea salt. Check with your health care provider or pharmacist before using salt  substitutes.  Do not fry foods. Cook foods using healthy methods such as baking, boiling, grilling, and broiling instead.  Cook with heart-healthy oils, such as olive, canola, soybean, or sunflower oil. Meal planning   Eat a balanced diet that includes: ? 5 or more servings of fruits and vegetables each day. At each meal, try to fill half of your plate with fruits and vegetables. ? Up to 6-8 servings of whole grains each day. ? Less than 6 oz of lean meat, poultry, or fish each day. A 3-oz serving of meat is about the same size as a deck of cards. One egg equals 1 oz. ? 2 servings of low-fat dairy each day. ? A serving of nuts, seeds, or beans 5 times each week. ? Heart-healthy fats. Healthy fats called Omega-3 fatty acids are found in foods such as flaxseeds and coldwater fish, like sardines, salmon, and mackerel.  Limit how much you eat of the following: ? Canned or prepackaged foods. ? Food that is high in trans fat, such as fried foods. ? Food that is high in saturated fat, such as fatty meat. ? Sweets, desserts, sugary drinks, and other foods with added sugar. ? Full-fat dairy products.  Do not salt foods before eating.  Try to eat at least 2 vegetarian meals each week.  Eat more home-cooked food and less restaurant, buffet, and fast food.  When eating at a restaurant, ask that your food be prepared with less salt  or no salt, if possible. What foods are recommended? The items listed may not be a complete list. Talk with your dietitian about what dietary choices are best for you. Grains Whole-grain or whole-wheat bread. Whole-grain or whole-wheat pasta. Brown rice. [place]. Bulgur. Whole-grain and low-sodium cereals. Pita bread. Low-fat, low-sodium crackers. Whole-wheat flour tortillas. Vegetables Fresh or frozen vegetables (raw, steamed, roasted, or grilled). Low-sodium or reduced-sodium tomato and vegetable juice. Low-sodium or reduced-sodium tomato sauce and tomato  paste. Low-sodium or reduced-sodium canned vegetables. Fruits All fresh, dried, or frozen fruit. Canned fruit in natural juice (without added sugar). Meat and other protein foods Skinless chicken or [place]. Ground chicken or [place]. Pork with fat trimmed off. Fish and seafood. Egg whites. Dried beans, peas, or lentils. Unsalted nuts, nut butters, and seeds. Unsalted canned beans. Lean cuts of beef with fat trimmed off. Low-sodium, lean deli meat. Dairy Low-fat (1%) or fat-free (skim) milk. Fat-free, low-fat, or reduced-fat cheeses. Nonfat, low-sodium ricotta or cottage cheese. Low-fat or nonfat yogurt. Low-fat, low-sodium cheese. Fats and oils Soft margarine without trans fats. Vegetable oil. Low-fat, reduced-fat, or light mayonnaise and salad dressings (reduced-sodium). Canola, safflower, olive, soybean, and sunflower oils. Avocado. Seasoning and other foods Herbs. Spices. Seasoning mixes without salt. Unsalted popcorn and pretzels. Fat-free sweets. What foods are not recommended? The items listed may not be a complete list. Talk with your dietitian about what dietary choices are best for you. Grains Baked goods made with fat, such as croissants, muffins, or some breads. Dry pasta or rice meal packs. Vegetables Creamed or fried vegetables. Vegetables in a cheese sauce. Regular canned vegetables (not low-sodium or reduced-sodium). Regular canned tomato sauce and paste (not low-sodium or reduced-sodium). Regular tomato and vegetable juice (not low-sodium or reduced-sodium). Angie Fava. Olives. Fruits Canned fruit in a light or heavy syrup. Fried fruit. Fruit in cream or butter sauce. Meat and other protein foods Fatty cuts of meat. Ribs. Fried meat. Berniece Salines. Sausage. Bologna and other processed lunch meats. Salami. Fatback. Hotdogs. Bratwurst. Salted nuts and seeds. Canned beans with added salt. Canned or smoked fish. Whole eggs or egg yolks. Chicken or [place] with skin. Dairy Whole or 2% milk,  cream, and half-and-half. Whole or full-fat cream cheese. Whole-fat or sweetened yogurt. Full-fat cheese. Nondairy creamers. Whipped toppings. Processed cheese and cheese spreads. Fats and oils Butter. Stick margarine. Lard. Shortening. Ghee. Bacon fat. Tropical oils, such as coconut, palm kernel, or palm oil. Seasoning and other foods Salted popcorn and pretzels. Onion salt, garlic salt, seasoned salt, table salt, and sea salt. Worcestershire sauce. Tartar sauce. Barbecue sauce. Teriyaki sauce. Soy sauce, including reduced-sodium. Steak sauce. Canned and packaged gravies. Fish sauce. Oyster sauce. Cocktail sauce. Horseradish that you find on the shelf. Ketchup. Mustard. Meat flavorings and tenderizers. Bouillon cubes. Hot sauce and Tabasco sauce. Premade or packaged marinades. Premade or packaged taco seasonings. Relishes. Regular salad dressings. Where to find more information:  National Heart, Lung, and [place]: https://wilson-eaton.com/  American Heart Association: www.heart.org Summary  The DASH eating plan is a healthy eating plan that has been shown to reduce high blood pressure (hypertension). It may also reduce your risk for type 2 diabetes, heart disease, and stroke.  With the DASH eating plan, you should limit salt (sodium) intake to 2,300 mg a day. If you have hypertension, you may need to reduce your sodium intake to 1,500 mg a day.  When on the DASH eating plan, aim to eat more fresh fruits and vegetables, whole grains, lean proteins, low-fat dairy,  and heart-healthy fats.  Work with your health care provider or diet and nutrition specialist (dietitian) to adjust your eating plan to your individual calorie needs. This information is not intended to replace advice given to you by your health care provider. Make sure you discuss any questions you have with your health care provider. Document Released: 07/19/2011 Document Revised: 07/23/2016 Document Reviewed: 07/23/2016 Elsevier  Interactive Patient Education  [employer].  Please be sure medication list is accurate. If a new problem present, please set up appointment sooner than planned today.

## 2017-09-30 NOTE — Progress Notes (Signed)
ACUTE VISIT   HPI:  Chief Complaint  Patient presents with  . Blood Pressure Check    Samuel Lyons is a 47 y.o. male, who is here today complaining of elevated BP during recent ER visit.  Hx of Hypertension:    Currently on Losartan 50 mg daily.  He was in the ER on 09/08/17 because left facial edema,his BP was elevated at  171/110. According to pt, he had a tooth abscess. Symptoms greatly improved with oral abx.   He is not checking BP at home. Last eye exam: Has not had one.  He is taking medications as instructed, no side effects reported.  He has not noted unusual headache, visual changes, exertional chest pain, dyspnea,  focal weakness, or edema.   Lab Results  Component Value Date   CREATININE 1.12 02/06/2017   BUN 14 02/06/2017   NA 137 02/06/2017   K 4.2 02/06/2017   CL 103 02/06/2017   CO2 27 02/06/2017      Review of Systems  Constitutional: Negative for activity change, appetite change, fatigue and fever.  HENT: Negative for nosebleeds, sore throat and trouble swallowing.   Eyes: Negative for redness and visual disturbance.  Respiratory: Negative for cough, shortness of breath and wheezing.   Cardiovascular: Negative for chest pain, palpitations and leg swelling.  Gastrointestinal: Negative for abdominal pain, nausea and vomiting.  Endocrine: Negative for cold intolerance and heat intolerance.  Genitourinary: Negative for decreased urine volume and hematuria.  Neurological: Negative for syncope, weakness and headaches.      Current Outpatient Medications on File Prior to Visit  Medication Sig Dispense Refill  . amoxicillin (AMOXIL) 500 MG capsule Take 2 capsules (1,000 mg total) by mouth 2 (two) times daily. 40 capsule 0  . desloratadine (CLARINEX) 5 MG tablet Take 1 tablet (5 mg total) by mouth daily. 90 tablet 3  . HYDROcodone-acetaminophen (NORCO/VICODIN) 5-325 MG tablet Take 1 tablet by mouth every 4 (four) hours as needed. 4  tablet 0  . ibuprofen (ADVIL,MOTRIN) 600 MG tablet Take 1 tablet (600 mg total) by mouth every 6 (six) hours as needed. 30 tablet 0  . triamcinolone ointment (KENALOG) 0.5 % Apply 1 application topically 2 (two) times daily. 30 g 0  . fluticasone (FLONASE) 50 MCG/ACT nasal spray Place 2 sprays into both nostrils daily. 16 g 5   No current facility-administered medications on file prior to visit.      Past Medical History:  Diagnosis Date  . Allergic rhinitis   . Essential hypertension   . Psoriasis   . Thrombocytosis (HCC)    Allergies  Allergen Reactions  . Tetanus Toxoids Swelling    Lip swelling  . Percocet [Oxycodone-Acetaminophen] Nausea And Vomiting    Social History   Socioeconomic History  . Marital status: Single    Spouse name: None  . Number of children: None  . Years of education: None  . Highest education level: None  Social Needs  . Financial resource strain: None  . Food insecurity - worry: None  . Food insecurity - inability: None  . Transportation needs - medical: None  . Transportation needs - non-medical: None  Occupational History  . Occupation: Public relations account executive: [employer]  Tobacco Use  . Smoking status: Never Smoker  . Smokeless tobacco: Never Used  Substance and Sexual Activity  . Alcohol use: Yes  . Drug use: No  . Sexual activity: None  Other Topics Concern  .  None  Social History Narrative   Married for 7 years- wife's name is Leeanne Mannan up in [place]   Works as a Psychologist, forensic for [employer]   3 children-twins fraternal (son & daughter age 45), son 76    Vitals:   09/30/17 0725  BP: 136/90  Pulse: 97  Resp: 12  Temp: 98.5 F (36.9 C)  SpO2: 98%   Body mass index is 35.38 kg/m.    Physical Exam  Nursing note and vitals reviewed. Constitutional: He is oriented to person, place, and time. He appears well-developed. No distress.  HENT:  Head: Normocephalic and atraumatic.  Mouth/Throat:  Oropharynx is clear and moist and mucous membranes are normal.  Eyes: Conjunctivae are normal. Pupils are equal, round, and reactive to light.  Cardiovascular: Normal rate and regular rhythm.  No murmur heard. Pulses:      Dorsalis pedis pulses are 2+ on the right side, and 2+ on the left side.  Respiratory: Effort normal and breath sounds normal. No respiratory distress.  GI: Soft. He exhibits no mass. There is no hepatomegaly. There is no tenderness.  Musculoskeletal: He exhibits no edema or tenderness.  Lymphadenopathy:    He has no cervical adenopathy.  Neurological: He is alert and oriented to person, place, and time. He has normal strength.  Skin: Skin is warm. No erythema.  Psychiatric: He has a normal mood and affect. Cognition and memory are normal.  Well groomed, good eye contact.    ASSESSMENT AND PLAN:   Mr. Samuel Lyons was seen today for blood pressure check.  Orders Placed This Encounter  Procedures  . Basic metabolic panel   Lab Results  Component Value Date   CREATININE 1.11 09/30/2017   BUN 9 09/30/2017   NA 139 09/30/2017   K 4.5 09/30/2017   CL 102 09/30/2017   CO2 28 09/30/2017     Hypertension Slightly above goal. No changes in current management for now. Recommend checking BP at home. DASH-low salt diet recommended. Eye exam recommended annually. F/U in 6 months with PCP, before if needed.     Return in about 6 months (around 03/30/2018) for PCP.      Cyndra Feinberg G. [place], MD  [facility]. [place] office.

## 2017-10-02 ENCOUNTER — Encounter: Payer: Self-pay | Admitting: Family Medicine

## 2017-10-30 ENCOUNTER — Telehealth: Payer: Self-pay | Admitting: Adult Health

## 2017-10-30 NOTE — Telephone Encounter (Signed)
Patient needs alternative BP medication sent to his pharmacy.

## 2017-10-30 NOTE — Telephone Encounter (Signed)
norvasc 5 mg daily x 30 days and follow up in 2 weeks for blood pressure check

## 2017-10-30 NOTE — Telephone Encounter (Signed)
Please [phone] call, if he does not answer leave message  What is new prescription and send to [place], [place]  [place] [address]  Phone: [phone] Fax: [phone]

## 2017-10-30 NOTE — Telephone Encounter (Signed)
Left a message for a return call.

## 2017-10-30 NOTE — Telephone Encounter (Signed)
Copied from [place] [id number]. Topic: Quick Communication - Rx Refill/Question >> Oct 30, 2017 11:32 AM Arletha Grippe wrote: Medication: losartan (COZAAR) 50 MG tablet   Has the patient contacted their pharmacy? Yes.   Pt got letter that med is on recall list, and needs to be switched   (Agent: If no, request that the patient contact the pharmacy for the refill.)   Preferred Pharmacy (with phone number or street name): [address]    Agent: Please be advised that RX refills may take up to 3 business days. We ask that you follow-up with your pharmacy.

## 2017-10-30 NOTE — Telephone Encounter (Signed)
Pt got letter that med is on recall list

## 2017-10-30 NOTE — Telephone Encounter (Signed)
Was his lot effected in the recall?

## 2017-10-31 MED ORDER — AMLODIPINE BESYLATE 5 MG PO TABS: 5.0000 mg | ORAL_TABLET | Freq: Every day | ORAL | 0 refills | Status: DC

## 2017-10-31 NOTE — Telephone Encounter (Signed)
Called and left a message as instructed.  New medication sent to the pharmacy by e-scribe.  Advised pt to call back and schedule appt for 2 wk follow up.

## 2018-02-06 ENCOUNTER — Ambulatory Visit: Payer: Self-pay | Admitting: *Deleted

## 2018-02-06 NOTE — Telephone Encounter (Signed)
Pt states that he has noticed an area of swelling to the left groin above the penis. Pt states he shaved his pubic area last week and noticed the swelling at that time. Pt states that the area is not painful, red, tender to touch and states he does not feel a knot to the area. Pt states that the swelling is not a small area and states it is his whole left side. Pt denies swelling to the right side. Pt denies any cuts to the area after shaving. Pt's PCP not available for appts this week or next week. Pt prefers to be seen by a male provider. Pt scheduled for OV on Monday with Dr. Raliegh Ip. Pt advised to call the office back or go to Urgent Care if symptoms become worse before seen for scheduled appt. Pt verbalized understanding.  Reason for Disposition . [1] Small swelling or lump AND [2] unexplained AND [3] present > 1 week  Answer Assessment - Initial Assessment Questions 1. APPEARANCE of SWELLING: "What does it look like?" (e.g., lymph node, insect bite, mole)     Left side above penis is swollen 2. SIZE: "How large is the swelling?" (inches, cm or compare to coins)     Pt states it is the whole left side not a small area 3. LOCATION: "Where is the swelling located?"     Left side of groin above penis 4. ONSET: "When did the swelling start?"     Last week 5. PAIN: "Is it painful?" If so, ask: "How much?"     No 6. ITCH: "Does it itch?" If so, ask: "How much?"     No itching verbalized 7. CAUSE: "What do you think caused the swelling?"     Shaved pubic area last week 8. OTHER SYMPTOMS: "Do you have any other symptoms?" (e.g., fever)     No  Protocols used: SKIN LUMP OR LOCALIZED SWELLING-A-AH

## 2018-02-10 ENCOUNTER — Encounter: Payer: Self-pay | Admitting: Internal Medicine

## 2018-02-10 ENCOUNTER — Ambulatory Visit (INDEPENDENT_AMBULATORY_CARE_PROVIDER_SITE_OTHER): Payer: BLUE CROSS/BLUE SHIELD | Admitting: Internal Medicine

## 2018-02-10 VITALS — BP 157/98 | HR 86 | Temp 98.7°F | Wt 233.0 lb

## 2018-02-10 DIAGNOSIS — I10 Essential (primary) hypertension: Secondary | ICD-10-CM

## 2018-02-10 MED ORDER — AMLODIPINE BESYLATE 10 MG PO TABS: 10.0000 mg | ORAL_TABLET | Freq: Every day | ORAL | 3 refills | Status: DC

## 2018-02-10 NOTE — Patient Instructions (Addendum)
Increase amlodipine to 10 mg daily   DASH Eating Plan DASH stands for "Dietary Approaches to Stop Hypertension." The DASH eating plan is a healthy eating plan that has been shown to reduce high blood pressure (hypertension). It may also reduce your risk for type 2 diabetes, heart disease, and stroke. The DASH eating plan may also help with weight loss. What are tips for following this plan? General guidelines  Avoid eating more than 2,300 mg (milligrams) of salt (sodium) a day. If you have hypertension, you may need to reduce your sodium intake to 1,500 mg a day.  Limit alcohol intake to no more than 1 drink a day for nonpregnant women and 2 drinks a day for men. One drink equals 12 oz of beer, 5 oz of wine, or 1 oz of hard liquor.  Work with your health care provider to maintain a healthy body weight or to lose weight. Ask what an ideal weight is for you.  Get at least 30 minutes of exercise that causes your heart to beat faster (aerobic exercise) most days of the week. Activities may include walking, swimming, or biking.  Work with your health care provider or diet and nutrition specialist (dietitian) to adjust your eating plan to your individual calorie needs. Reading food labels  Check food labels for the amount of sodium per serving. Choose foods with less than 5 percent of the Daily Value of sodium. Generally, foods with less than 300 mg of sodium per serving fit into this eating plan.  To find whole grains, look for the word "whole" as the first word in the ingredient list. Shopping  Buy products labeled as "low-sodium" or "no salt added."  Buy fresh foods. Avoid canned foods and premade or frozen meals. Cooking  Avoid adding salt when cooking. Use salt-free seasonings or herbs instead of table salt or sea salt. Check with your health care provider or pharmacist before using salt substitutes.  Do not fry foods. Cook foods using healthy methods such as baking, boiling, grilling,  and broiling instead.  Cook with heart-healthy oils, such as olive, canola, soybean, or sunflower oil. Meal planning   Eat a balanced diet that includes: ? 5 or more servings of fruits and vegetables each day. At each meal, try to fill half of your plate with fruits and vegetables. ? Up to 6-8 servings of whole grains each day. ? Less than 6 oz of lean meat, poultry, or fish each day. A 3-oz serving of meat is about the same size as a deck of cards. One egg equals 1 oz. ? 2 servings of low-fat dairy each day. ? A serving of nuts, seeds, or beans 5 times each week. ? Heart-healthy fats. Healthy fats called Omega-3 fatty acids are found in foods such as flaxseeds and coldwater fish, like sardines, salmon, and mackerel.  Limit how much you eat of the following: ? Canned or prepackaged foods. ? Food that is high in trans fat, such as fried foods. ? Food that is high in saturated fat, such as fatty meat. ? Sweets, desserts, sugary drinks, and other foods with added sugar. ? Full-fat dairy products.  Do not salt foods before eating.  Try to eat at least 2 vegetarian meals each week.  Eat more home-cooked food and less restaurant, buffet, and fast food.  When eating at a restaurant, ask that your food be prepared with less salt or no salt, if possible. What foods are recommended? The items listed may not be a  complete list. Talk with your dietitian about what dietary choices are best for you. Grains Whole-grain or whole-wheat bread. Whole-grain or whole-wheat pasta. Brown rice. [place]. Bulgur. Whole-grain and low-sodium cereals. Pita bread. Low-fat, low-sodium crackers. Whole-wheat flour tortillas. Vegetables Fresh or frozen vegetables (raw, steamed, roasted, or grilled). Low-sodium or reduced-sodium tomato and vegetable juice. Low-sodium or reduced-sodium tomato sauce and tomato paste. Low-sodium or reduced-sodium canned vegetables. Fruits All fresh, dried, or frozen fruit.  Canned fruit in natural juice (without added sugar). Meat and other protein foods Skinless chicken or [place]. Ground chicken or [place]. Pork with fat trimmed off. Fish and seafood. Egg whites. Dried beans, peas, or lentils. Unsalted nuts, nut butters, and seeds. Unsalted canned beans. Lean cuts of beef with fat trimmed off. Low-sodium, lean deli meat. Dairy Low-fat (1%) or fat-free (skim) milk. Fat-free, low-fat, or reduced-fat cheeses. Nonfat, low-sodium ricotta or cottage cheese. Low-fat or nonfat yogurt. Low-fat, low-sodium cheese. Fats and oils Soft margarine without trans fats. Vegetable oil. Low-fat, reduced-fat, or light mayonnaise and salad dressings (reduced-sodium). Canola, safflower, olive, soybean, and sunflower oils. Avocado. Seasoning and other foods Herbs. Spices. Seasoning mixes without salt. Unsalted popcorn and pretzels. Fat-free sweets. What foods are not recommended? The items listed may not be a complete list. Talk with your dietitian about what dietary choices are best for you. Grains Baked goods made with fat, such as croissants, muffins, or some breads. Dry pasta or rice meal packs. Vegetables Creamed or fried vegetables. Vegetables in a cheese sauce. Regular canned vegetables (not low-sodium or reduced-sodium). Regular canned tomato sauce and paste (not low-sodium or reduced-sodium). Regular tomato and vegetable juice (not low-sodium or reduced-sodium). Angie Fava. Olives. Fruits Canned fruit in a light or heavy syrup. Fried fruit. Fruit in cream or butter sauce. Meat and other protein foods Fatty cuts of meat. Ribs. Fried meat. Berniece Salines. Sausage. Bologna and other processed lunch meats. Salami. Fatback. Hotdogs. Bratwurst. Salted nuts and seeds. Canned beans with added salt. Canned or smoked fish. Whole eggs or egg yolks. Chicken or [place] with skin. Dairy Whole or 2% milk, cream, and half-and-half. Whole or full-fat cream cheese. Whole-fat or sweetened yogurt. Full-fat  cheese. Nondairy creamers. Whipped toppings. Processed cheese and cheese spreads. Fats and oils Butter. Stick margarine. Lard. Shortening. Ghee. Bacon fat. Tropical oils, such as coconut, palm kernel, or palm oil. Seasoning and other foods Salted popcorn and pretzels. Onion salt, garlic salt, seasoned salt, table salt, and sea salt. Worcestershire sauce. Tartar sauce. Barbecue sauce. Teriyaki sauce. Soy sauce, including reduced-sodium. Steak sauce. Canned and packaged gravies. Fish sauce. Oyster sauce. Cocktail sauce. Horseradish that you find on the shelf. Ketchup. Mustard. Meat flavorings and tenderizers. Bouillon cubes. Hot sauce and Tabasco sauce. Premade or packaged marinades. Premade or packaged taco seasonings. Relishes. Regular salad dressings. Where to find more information:  National Heart, Lung, and [place]: https://wilson-eaton.com/  American Heart Association: www.heart.org Summary  The DASH eating plan is a healthy eating plan that has been shown to reduce high blood pressure (hypertension). It may also reduce your risk for type 2 diabetes, heart disease, and stroke.  With the DASH eating plan, you should limit salt (sodium) intake to 2,300 mg a day. If you have hypertension, you may need to reduce your sodium intake to 1,500 mg a day.  When on the DASH eating plan, aim to eat more fresh fruits and vegetables, whole grains, lean proteins, low-fat dairy, and heart-healthy fats.  Work with your health care provider or diet and nutrition specialist (dietitian)  to adjust your eating plan to your individual calorie needs. This information is not intended to replace advice given to you by your health care provider. Make sure you discuss any questions you have with your health care provider. Document Released: 07/19/2011 Document Revised: 07/23/2016 Document Reviewed: 07/23/2016 Elsevier Interactive Patient Education  2018 [place] your sodium (Salt) intake  You need to  lose weight.  Consider a lower calorie diet and regular exercise.  Please check your blood pressure on a regular basis.  If it is consistently greater than 140/90, please make an office appointment.  Return in 4 weeks for follow-up     Inguinal Hernia, Adult An inguinal hernia is when fat or the intestines push through the area where the leg meets the lower belly (groin) and make a rounded lump (bulge). This condition happens over time. There are three types of inguinal hernias. These types include:  Hernias that can be pushed back into the belly (are reducible).  Hernias that cannot be pushed back into the belly (are incarcerated).  Hernias that cannot be pushed back into the belly and lose their blood supply (get strangulated). This type needs emergency surgery.  Follow these instructions at home: Lifestyle  Drink enough fluid to keep your urine (pee) clear or pale yellow.  Eat plenty of fruits, vegetables, and whole grains. These have a lot of fiber. Talk with your doctor if you have questions.  Avoid lifting heavy objects.  Avoid standing for long periods of time.  Do not use tobacco products. These include cigarettes, chewing tobacco, or e-cigarettes. If you need help quitting, ask your doctor.  Try to stay at a healthy weight. General instructions  Do not try to force the hernia back in.  Watch your hernia for any changes in color or size. Let your doctor know if there are any changes.  Take over-the-counter and prescription medicines only as told by your doctor.  Keep all follow-up visits as told by your doctor. This is important. Contact a doctor if:  You have a fever.  You have new symptoms.  Your symptoms get worse. Get help right away if:  The area where the legs meets the lower belly has: ? Pain that gets worse suddenly. ? A bulge that gets bigger suddenly and does not go down. ? A bulge that turns red or purple. ? A bulge that is painful to the  touch.  You are a man and your scrotum: ? Suddenly feels painful. ? Suddenly changes in size.  You feel sick to your stomach (nauseous) and this feeling does not go away.  You throw up (vomit) and this keeps happening.  You feel your heart beating a lot more quickly than normal.  You cannot poop (have a bowel movement) or pass gas. This information is not intended to replace advice given to you by your health care provider. Make sure you discuss any questions you have with your health care provider. Document Released: 08/30/2006 Document Revised: 01/05/2016 Document Reviewed: 06/09/2014 Elsevier Interactive Patient Education  2018 [employer].

## 2018-02-10 NOTE — Progress Notes (Signed)
Subjective:    Patient ID: Samuel Lyons, male    DOB: [birth year], 47 y.o.   MRN: [id number]  HPI  BP Readings from Last 3 Encounters:  02/10/18 (!) 157/98  09/30/17 136/90  09/11/17 (!) 33/50   47 year old patient who is seen today with a asymptomatic bulge in the left groin region as his chief complaint.  He first noticed this about 2 weeks ago.  It has been unchanged He has a history of hypertension which has been treated with amlodipine 5 mg daily.  No home blood pressure monitoring.  He has been compliant with his medications  Past Medical History:  Diagnosis Date  . Allergic rhinitis   . Essential hypertension   . Psoriasis   . Thrombocytosis ([place])      Social History   Socioeconomic History  . Marital status: Single    Spouse name: Not on file  . Number of children: Not on file  . Years of education: Not on file  . Highest education level: Not on file  Occupational History  . Occupation: Public relations account executive: [employer]  . Financial resource strain: Not on file  . Food insecurity:    Worry: Not on file    Inability: Not on file  . Transportation needs:    Medical: Not on file    Non-medical: Not on file  Tobacco Use  . Smoking status: Never Smoker  . Smokeless tobacco: Never Used  Substance and Sexual Activity  . Alcohol use: Yes  . Drug use: No  . Sexual activity: Not on file  Lifestyle  . Physical activity:    Days per week: Not on file    Minutes per session: Not on file  . Stress: Not on file  Relationships  . Social connections:    Talks on phone: Not on file    Gets together: Not on file    Attends religious service: Not on file    Active member of club or organization: Not on file    Attends meetings of clubs or organizations: Not on file    Relationship status: Not on file  . Intimate partner violence:    Fear of current or ex partner: Not on file    Emotionally abused: Not on file    Physically  abused: Not on file    Forced sexual activity: Not on file  Other Topics Concern  . Not on file  Social History Narrative   Married for 7 years- wife's name is Leeanne Mannan up in [place]   Works as a Psychologist, forensic for [employer]   3 children-twins fraternal (son & daughter age 81), son 29    Past Surgical History:  Procedure Laterality Date  . KNEE SURGERY Right     Family History  Problem Relation Age of Onset  . Ulcers Mother   . Hypertension Maternal Grandmother   . Stroke Maternal Grandfather   . Ovarian cancer Paternal Aunt     Allergies  Allergen Reactions  . Tetanus Toxoids Swelling    Lip swelling  . Percocet [Oxycodone-Acetaminophen] Nausea And Vomiting    Current Outpatient Medications on File Prior to Visit  Medication Sig Dispense Refill  . amLODipine (NORVASC) 5 MG tablet Take 1 tablet (5 mg total) by mouth daily. 30 tablet 0  . desloratadine (CLARINEX) 5 MG tablet Take 1 tablet (5 mg total) by mouth daily. 90 tablet 3  . triamcinolone ointment (  KENALOG) 0.5 % Apply 1 application topically 2 (two) times daily. 30 g 0  . fluticasone (FLONASE) 50 MCG/ACT nasal spray Place 2 sprays into both nostrils daily. 16 g 5   No current facility-administered medications on file prior to visit.     BP (!) 157/98 (BP Location: Right Wrist, Patient Position: Sitting, Cuff Size: Small)   Pulse 86   Temp 98.7 F (37.1 C) (Oral)   Wt 233 lb (105.7 kg)   SpO2 97%   BMI 35.95 kg/m     Review of Systems  Constitutional: Negative for appetite change, chills, fatigue and fever.  HENT: Negative for congestion, dental problem, ear pain, hearing loss, sore throat, tinnitus, trouble swallowing and voice change.   Eyes: Negative for pain, discharge and visual disturbance.  Respiratory: Negative for cough, chest tightness, wheezing and stridor.   Cardiovascular: Negative for chest pain, palpitations and leg swelling.  Gastrointestinal: Negative for abdominal  distention, abdominal pain, blood in stool, constipation, diarrhea, nausea and vomiting.  Genitourinary: Negative for difficulty urinating, discharge, flank pain, genital sores, hematuria and urgency.  Musculoskeletal: Negative for arthralgias, back pain, gait problem, joint swelling, myalgias and neck stiffness.  Skin: Negative for rash.  Neurological: Negative for dizziness, syncope, speech difficulty, weakness, numbness and headaches.  Hematological: Negative for adenopathy. Does not bruise/bleed easily.  Psychiatric/Behavioral: Negative for behavioral problems and dysphoric mood. The patient is not nervous/anxious.        Objective:   Physical Exam  Constitutional: He appears well-developed and well-nourished. No distress.  Blood pressure 156/98  Cardiovascular: Normal rate and regular rhythm.  Genitourinary: Penis normal.  Genitourinary Comments: Left inguinal hernia.          Assessment & Plan:   Asymptomatic left inguinal hernia.  Will observe Hypertension suboptimal control.  Will increase amlodipine to 10 mg daily Low-salt diet encouraged DASH diet Follow-up 4 weeks [place] weight loss recommended  Marletta Lor

## 2018-03-14 ENCOUNTER — Ambulatory Visit (INDEPENDENT_AMBULATORY_CARE_PROVIDER_SITE_OTHER): Payer: BLUE CROSS/BLUE SHIELD | Admitting: Adult Health

## 2018-03-14 ENCOUNTER — Encounter: Payer: Self-pay | Admitting: Adult Health

## 2018-03-14 VITALS — BP 158/100 | HR 99 | Temp 98.8°F | Wt 228.0 lb

## 2018-03-14 DIAGNOSIS — I10 Essential (primary) hypertension: Secondary | ICD-10-CM

## 2018-03-14 MED ORDER — LOSARTAN POTASSIUM 50 MG PO TABS: 50.0000 mg | ORAL_TABLET | Freq: Every day | ORAL | 0 refills | Status: DC

## 2018-03-14 NOTE — Progress Notes (Signed)
Subjective:    Patient ID: Samuel Lyons, male    DOB: April 10, 1971, 47 y.o.   MRN: [id number]  HPI  47 year old male who  has a past medical history of Allergic rhinitis, Essential hypertension, Psoriasis, and Thrombocytosis ([place]). He presents to the office today for follow up regarding hypertension. He was seen by another provider in the practice about one month ago and had suboptimal blood pressure readings. Norvasc was increased form 5 mg to 10 mg.   Today in the office he reports that he has been monitoring his BP at home but with a wrist cuff. His BP continues to be high and has readings up to 190/100 ( doubt this was a true BP reading)   He had much better BP control with Losartan in the past   Review of Systems See HPI   Past Medical History:  Diagnosis Date  . Allergic rhinitis   . Essential hypertension   . Psoriasis   . Thrombocytosis ([place])     Social History   Socioeconomic History  . Marital status: Single    Spouse name: Not on file  . Number of children: Not on file  . Years of education: Not on file  . Highest education level: Not on file  Occupational History  . Occupation: Public relations account executive: [employer]  . Financial resource strain: Not on file  . Food insecurity:    Worry: Not on file    Inability: Not on file  . Transportation needs:    Medical: Not on file    Non-medical: Not on file  Tobacco Use  . Smoking status: Never Smoker  . Smokeless tobacco: Never Used  Substance and Sexual Activity  . Alcohol use: Yes  . Drug use: No  . Sexual activity: Not on file  Lifestyle  . Physical activity:    Days per week: Not on file    Minutes per session: Not on file  . Stress: Not on file  Relationships  . Social connections:    Talks on phone: Not on file    Gets together: Not on file    Attends religious service: Not on file    Active member of club or organization: Not on file    Attends meetings of clubs or  organizations: Not on file    Relationship status: Not on file  . Intimate partner violence:    Fear of current or ex partner: Not on file    Emotionally abused: Not on file    Physically abused: Not on file    Forced sexual activity: Not on file  Other Topics Concern  . Not on file  Social History Narrative   Married for 7 years- wife's name is Leeanne Mannan up in [place]   Works as a Psychologist, forensic for [employer]   3 children-twins fraternal (son & daughter age 49), son 78    Past Surgical History:  Procedure Laterality Date  . KNEE SURGERY Right     Family History  Problem Relation Age of Onset  . Ulcers Mother   . Hypertension Maternal Grandmother   . Stroke Maternal Grandfather   . Ovarian cancer Paternal Aunt     Allergies  Allergen Reactions  . Tetanus Toxoids Swelling    Lip swelling  . Percocet [Oxycodone-Acetaminophen] Nausea And Vomiting    Current Outpatient Medications on File Prior to Visit  Medication Sig Dispense Refill  .  desloratadine (CLARINEX) 5 MG tablet Take 1 tablet (5 mg total) by mouth daily. 90 tablet 3  . triamcinolone ointment (KENALOG) 0.5 % Apply 1 application topically 2 (two) times daily. 30 g 0  . fluticasone (FLONASE) 50 MCG/ACT nasal spray Place 2 sprays into both nostrils daily. 16 g 5   No current facility-administered medications on file prior to visit.     BP (!) 158/100   Pulse 99   Temp 98.8 F (37.1 C)   Wt 228 lb (103.4 kg)   BMI 35.18 kg/m       Objective:   Physical Exam  Constitutional: He is oriented to person, place, and time. He appears well-developed and well-nourished. No distress.  Cardiovascular: Normal rate and regular rhythm.  Pulmonary/Chest: Effort normal and breath sounds normal.  Neurological: He is alert and oriented to person, place, and time.  Skin: Skin is warm and dry. He is not diaphoretic.  Psychiatric: He has a normal mood and affect. His behavior is normal. Thought content  normal.  Nursing note and vitals reviewed.     Assessment & Plan:  1. Essential hypertension - Will put back on Cozzar. He will follow up in 2-4 weeks for CPE and we will evaluate then. Advised to get arm cuff and monitor at home.  - losartan (COZAAR) 50 MG tablet; Take 1 tablet (50 mg total) by mouth daily.  Dispense: 90 tablet; Refill: 0  Dorothyann Peng, NP

## 2018-04-11 ENCOUNTER — Encounter: Payer: Self-pay | Admitting: Adult Health

## 2018-04-11 ENCOUNTER — Ambulatory Visit (INDEPENDENT_AMBULATORY_CARE_PROVIDER_SITE_OTHER): Payer: BLUE CROSS/BLUE SHIELD | Admitting: Adult Health

## 2018-04-11 VITALS — BP 150/96 | Temp 98.6°F | Ht 67.5 in | Wt 229.0 lb

## 2018-04-11 DIAGNOSIS — Z125 Encounter for screening for malignant neoplasm of prostate: Secondary | ICD-10-CM | POA: Diagnosis not present

## 2018-04-11 DIAGNOSIS — E782 Mixed hyperlipidemia: Secondary | ICD-10-CM | POA: Diagnosis not present

## 2018-04-11 DIAGNOSIS — Z114 Encounter for screening for human immunodeficiency virus [HIV]: Secondary | ICD-10-CM | POA: Diagnosis not present

## 2018-04-11 DIAGNOSIS — I10 Essential (primary) hypertension: Secondary | ICD-10-CM | POA: Diagnosis not present

## 2018-04-11 DIAGNOSIS — E785 Hyperlipidemia, unspecified: Secondary | ICD-10-CM | POA: Insufficient documentation

## 2018-04-11 DIAGNOSIS — Z0000 Encounter for general adult medical examination without abnormal findings: Secondary | ICD-10-CM | POA: Diagnosis not present

## 2018-04-11 LAB — CBC WITH DIFFERENTIAL/PLATELET
BASOS ABS: 0 10*3/uL (ref 0.0–0.1)
Basophils Relative: 0.8 % (ref 0.0–3.0)
EOS ABS: 0.2 10*3/uL (ref 0.0–0.7)
Eosinophils Relative: 3.6 % (ref 0.0–5.0)
HEMATOCRIT: 43.9 % (ref 39.0–52.0)
Hemoglobin: 14.5 g/dL (ref 13.0–17.0)
Lymphocytes Relative: 35.5 % (ref 12.0–46.0)
Lymphs Abs: 1.5 10*3/uL (ref 0.7–4.0)
MCHC: 33.1 g/dL (ref 30.0–36.0)
MCV: 79 fl (ref 78.0–100.0)
Monocytes Absolute: 0.3 10*3/uL (ref 0.1–1.0)
Monocytes Relative: 7.2 % (ref 3.0–12.0)
NEUTROS ABS: 2.2 10*3/uL (ref 1.4–7.7)
Neutrophils Relative %: 52.9 % (ref 43.0–77.0)
PLATELETS: 234 10*3/uL (ref 150.0–400.0)
RBC: 5.55 Mil/uL (ref 4.22–5.81)
RDW: 14.5 % (ref 11.5–15.5)
WBC: 4.3 10*3/uL (ref 4.0–10.5)

## 2018-04-11 LAB — TSH: TSH: 1.74 u[IU]/mL (ref 0.35–4.50)

## 2018-04-11 LAB — PSA: PSA: 0.5 ng/mL (ref 0.10–4.00)

## 2018-04-11 LAB — LIPID PANEL
CHOL/HDL RATIO: 6
Cholesterol: 206 mg/dL — ABNORMAL HIGH (ref 0–200)
HDL: 36.9 mg/dL — ABNORMAL LOW (ref >39.00)
LDL Cholesterol: 139 mg/dL — ABNORMAL HIGH (ref 0–99)
NONHDL: 168.76
Triglycerides: 150 mg/dL — ABNORMAL HIGH (ref 0.0–149.0)
VLDL: 30 mg/dL (ref 0.0–40.0)

## 2018-04-11 LAB — BASIC METABOLIC PANEL
BUN: 13 mg/dL (ref 6–23)
CO2: 29 mEq/L (ref 19–32)
CREATININE: 1.2 mg/dL (ref 0.40–1.50)
Calcium: 9.7 mg/dL (ref 8.4–10.5)
Chloride: 103 mEq/L (ref 96–112)
GFR: 84.32 mL/min (ref >60.00)
Glucose, Bld: 100 mg/dL — ABNORMAL HIGH (ref 70–99)
Potassium: 4.5 mEq/L (ref 3.5–5.1)
Sodium: 139 mEq/L (ref 135–145)

## 2018-04-11 LAB — HEPATIC FUNCTION PANEL
ALT: 28 U/L (ref 0–53)
AST: 19 U/L (ref 0–37)
Albumin: 4.4 g/dL (ref 3.5–5.2)
Alkaline Phosphatase: 162 U/L — ABNORMAL HIGH (ref 39–117)
Bilirubin, Direct: 0.1 mg/dL (ref 0.0–0.3)
TOTAL PROTEIN: 7 g/dL (ref 6.0–8.3)
Total Bilirubin: 0.5 mg/dL (ref 0.2–1.2)

## 2018-04-11 MED ORDER — LOSARTAN POTASSIUM 100 MG PO TABS: 100.0000 mg | ORAL_TABLET | Freq: Every day | ORAL | 3 refills | Status: DC

## 2018-04-11 NOTE — Progress Notes (Signed)
Subjective:    Patient ID: Samuel Lyons, male    DOB: 03-23-1971, 46 y.o.   MRN: [id number]  HPI Patient presents for yearly preventative medicine examination. He is a pleasant 47 year old male who  has a past medical history of Allergic rhinitis, Essential hypertension, Hyperlipidemia, Psoriasis, and Thrombocytosis ([place]).  Essential Hypertension -currently prescribed Cozaar 50 mg tablets.  During his last visit he was switched from Norvasc 10 mg back to Cozaar due to suboptimal blood pressure. He reports that he has been checking his BP at home and has been getting readings in the 150/90-100's. He denies any headaches or blurred vision.  BP Readings from Last 3 Encounters:  04/11/18 (!) 150/96  03/14/18 (!) 158/100  02/10/18 (!) 157/98   Seasonal Allergies -uses Claritin and Flonase-Controlled  Hyperlipidemia -not currently prescribed any medication  Lab Results  Component Value Date   CHOL 252 (H) 02/06/2017   HDL 40.10 02/06/2017   LDLCALC 173 (H) 02/06/2017   TRIG 199.0 (H) 02/06/2017   CHOLHDL 6 02/06/2017    All immunizations and health maintenance protocols were reviewed with the patient and needed orders were placed. UTD on vaccinations   Appropriate screening laboratory values were ordered for the patient including screening of hyperlipidemia, renal function and hepatic function. If indicated by BPH, a PSA was ordered.  Medication reconciliation,  past medical history, social history, problem list and allergies were reviewed in detail with the patient  Goals were established with regard to weight loss, exercise, and  diet in compliance with medications. He has been working diet but does not exercise.   Wt Readings from Last 3 Encounters:  04/11/18 229 lb (103.9 kg)  03/14/18 228 lb (103.4 kg)  02/10/18 233 lb (105.7 kg)    Review of Systems  Constitutional: Negative.   HENT: Negative.   Eyes: Negative.   Respiratory: Negative.   Cardiovascular: Negative.    Gastrointestinal: Negative.   Endocrine: Negative.   Genitourinary: Negative.   Musculoskeletal: Negative.   Skin: Negative.   Allergic/Immunologic: Negative.   Neurological: Negative.   Hematological: Negative.   Psychiatric/Behavioral: Negative.   All other systems reviewed and are negative.  Past Medical History:  Diagnosis Date  . Allergic rhinitis   . Essential hypertension   . Hyperlipidemia   . Psoriasis   . Thrombocytosis ([place])     Social History   Socioeconomic History  . Marital status: Single    Spouse name: Not on file  . Number of children: Not on file  . Years of education: Not on file  . Highest education level: Not on file  Occupational History  . Occupation: Public relations account executive: [employer]  . Financial resource strain: Not on file  . Food insecurity:    Worry: Not on file    Inability: Not on file  . Transportation needs:    Medical: Not on file    Non-medical: Not on file  Tobacco Use  . Smoking status: Never Smoker  . Smokeless tobacco: Never Used  Substance and Sexual Activity  . Alcohol use: Yes  . Drug use: No  . Sexual activity: Not on file  Lifestyle  . Physical activity:    Days per week: Not on file    Minutes per session: Not on file  . Stress: Not on file  Relationships  . Social connections:    Talks on phone: Not on file    Gets together: Not on  file    Attends religious service: Not on file    Active member of club or organization: Not on file    Attends meetings of clubs or organizations: Not on file    Relationship status: Not on file  . Intimate partner violence:    Fear of current or ex partner: Not on file    Emotionally abused: Not on file    Physically abused: Not on file    Forced sexual activity: Not on file  Other Topics Concern  . Not on file  Social History Narrative   Married for 7 years- wife's name is Leeanne Mannan up in [place]   Works as a Psychologist, forensic for [employer]   3 children-twins fraternal (son & daughter age 30), son 40    Past Surgical History:  Procedure Laterality Date  . KNEE SURGERY Right     Family History  Problem Relation Age of Onset  . Ulcers Mother   . Hypertension Maternal Grandmother   . Stroke Maternal Grandfather   . Ovarian cancer Paternal Aunt     Allergies  Allergen Reactions  . Tetanus Toxoids Swelling    Lip swelling  . Percocet [Oxycodone-Acetaminophen] Nausea And Vomiting    Current Outpatient Medications on File Prior to Visit  Medication Sig Dispense Refill  . desloratadine (CLARINEX) 5 MG tablet Take 1 tablet (5 mg total) by mouth daily. 90 tablet 3  . triamcinolone ointment (KENALOG) 0.5 % Apply 1 application topically 2 (two) times daily. 30 g 0  . fluticasone (FLONASE) 50 MCG/ACT nasal spray Place 2 sprays into both nostrils daily. 16 g 5   No current facility-administered medications on file prior to visit.     BP (!) 150/96   Temp 98.6 F (37 C)   Ht 5' 7.5" (1.715 m)   Wt 229 lb (103.9 kg)   BMI 35.34 kg/m       Objective:   Physical Exam  Constitutional: He is oriented to person, place, and time. He appears well-developed and well-nourished. No distress.  Obese    HENT:  Head: Normocephalic and atraumatic.  Right Ear: External ear normal.  Left Ear: External ear normal.  Nose: Nose normal.  Mouth/Throat: Oropharynx is clear and moist. No oropharyngeal exudate.  Eyes: Pupils are equal, round, and reactive to light. Conjunctivae and EOM are normal. Right eye exhibits no discharge. Left eye exhibits no discharge. No scleral icterus.  Neck: Normal range of motion. Neck supple. No JVD present. No tracheal deviation present. No thyromegaly present.  Cardiovascular: Normal rate, regular rhythm, normal heart sounds and intact distal pulses. Exam reveals no gallop and no friction rub.  No murmur heard. Pulmonary/Chest: Effort normal and breath sounds normal. No stridor. No  respiratory distress. He has no wheezes. He has no rales. He exhibits no tenderness.  Abdominal: Soft. Bowel sounds are normal. He exhibits no distension and no mass. There is no tenderness. There is no rebound and no guarding. No hernia.  Musculoskeletal: Normal range of motion. He exhibits no edema, tenderness or deformity.  Lymphadenopathy:    He has no cervical adenopathy.  Neurological: He is alert and oriented to person, place, and time. He displays normal reflexes. No cranial nerve deficit or sensory deficit. He exhibits normal muscle tone. Coordination normal.  Skin: Skin is warm and dry. No rash noted. He is not diaphoretic. No erythema. No pallor.  Psychiatric: He has a normal mood and affect. His behavior is normal. Judgment  and thought content normal.  Nursing note and vitals reviewed.     Assessment & Plan:  1. Preventative health care - He needs to start working out 150 min a week  - Continue to work on heart healthy diet  - Follow up in one year for CPE  - Basic metabolic panel - CBC with Differential/Platelet - Hepatic function panel - Lipid panel - TSH  2. Essential hypertension - Not at goal. Will increase Cozaar to 100 mg. Consider HCTZ in the future  - Follow up in 2 weeks  - Basic metabolic panel - CBC with Differential/Platelet - Hepatic function panel - Lipid panel - TSH - losartan (COZAAR) 100 MG tablet; Take 1 tablet (100 mg total) by mouth daily.  Dispense: 90 tablet; Refill: 3  3. Mixed hyperlipidemia - Likely add statin  - Basic metabolic panel - CBC with Differential/Platelet - Hepatic function panel - Lipid panel - TSH  4. Prostate cancer screening  - PSA  5. Encounter for screening for HIV  - HIV antibody  Dorothyann Peng, NP

## 2018-04-12 LAB — HIV ANTIBODY (ROUTINE TESTING W REFLEX): HIV 1&2 Ab, 4th Generation: NONREACTIVE

## 2018-04-15 ENCOUNTER — Other Ambulatory Visit: Payer: Self-pay | Admitting: Adult Health

## 2018-04-15 DIAGNOSIS — R748 Abnormal levels of other serum enzymes: Secondary | ICD-10-CM

## 2018-04-25 ENCOUNTER — Ambulatory Visit: Payer: BLUE CROSS/BLUE SHIELD | Admitting: Adult Health

## 2018-04-29 ENCOUNTER — Encounter: Payer: Self-pay | Admitting: Adult Health

## 2018-04-29 ENCOUNTER — Ambulatory Visit (INDEPENDENT_AMBULATORY_CARE_PROVIDER_SITE_OTHER): Payer: BLUE CROSS/BLUE SHIELD | Admitting: Adult Health

## 2018-04-29 VITALS — BP 150/110 | Temp 98.5°F | Ht 66.5 in | Wt 231.0 lb

## 2018-04-29 DIAGNOSIS — R748 Abnormal levels of other serum enzymes: Secondary | ICD-10-CM | POA: Diagnosis not present

## 2018-04-29 DIAGNOSIS — I10 Essential (primary) hypertension: Secondary | ICD-10-CM | POA: Diagnosis not present

## 2018-04-29 LAB — HEPATIC FUNCTION PANEL
ALBUMIN: 4.3 g/dL (ref 3.5–5.2)
ALT: 35 U/L (ref 0–53)
AST: 23 U/L (ref 0–37)
Alkaline Phosphatase: 158 U/L — ABNORMAL HIGH (ref 39–117)
Bilirubin, Direct: 0.1 mg/dL (ref 0.0–0.3)
TOTAL PROTEIN: 7 g/dL (ref 6.0–8.3)
Total Bilirubin: 0.4 mg/dL (ref 0.2–1.2)

## 2018-04-29 LAB — GAMMA GT: GGT: 41 U/L (ref 7–51)

## 2018-04-29 MED ORDER — AMLODIPINE BESYLATE 5 MG PO TABS: 5.0000 mg | ORAL_TABLET | Freq: Every day | ORAL | 1 refills | Status: DC

## 2018-04-29 NOTE — Progress Notes (Signed)
Subjective:    Patient ID: Samuel Lyons, male    DOB: August 11, 1971, 47 y.o.   MRN: [id number]  HPI 47 year old male who  has a past medical history of Allergic rhinitis, Essential hypertension, Hyperlipidemia, Psoriasis, and Thrombocytosis ([place]).  He presents to the office today for follow-up regarding hypertension.  Blood pressure was not at goal during his CPE and Cozaar was increased to 100 mg daily. He reports that at home his blood pressure readings have been in the 150/110's. He checks his BP in the afternoon. Reports that he has cut back on eating foods high in fat and started riding his bike around the neighborhood.   Wt Readings from Last 3 Encounters:  04/29/18 231 lb (104.8 kg)  04/11/18 229 lb (103.9 kg)  03/14/18 228 lb (103.4 kg)    He was also noticed that  alk phos been trending upwards over the last year.  Abdominal ultrasound was ordered but he has not received a call yet to schedule  Review of Systems See HPI   Past Medical History:  Diagnosis Date  . Allergic rhinitis   . Essential hypertension   . Hyperlipidemia   . Psoriasis   . Thrombocytosis ([place])     Social History   Socioeconomic History  . Marital status: Single    Spouse name: Not on file  . Number of children: Not on file  . Years of education: Not on file  . Highest education level: Not on file  Occupational History  . Occupation: Public relations account executive: [employer]  . Financial resource strain: Not on file  . Food insecurity:    Worry: Not on file    Inability: Not on file  . Transportation needs:    Medical: Not on file    Non-medical: Not on file  Tobacco Use  . Smoking status: Never Smoker  . Smokeless tobacco: Never Used  Substance and Sexual Activity  . Alcohol use: Yes  . Drug use: No  . Sexual activity: Not on file  Lifestyle  . Physical activity:    Days per week: Not on file    Minutes per session: Not on file  . Stress: Not on file    Relationships  . Social connections:    Talks on phone: Not on file    Gets together: Not on file    Attends religious service: Not on file    Active member of club or organization: Not on file    Attends meetings of clubs or organizations: Not on file    Relationship status: Not on file  . Intimate partner violence:    Fear of current or ex partner: Not on file    Emotionally abused: Not on file    Physically abused: Not on file    Forced sexual activity: Not on file  Other Topics Concern  . Not on file  Social History Narrative   Married for 7 years- wife's name is Leeanne Mannan up in [place]   Works as a Psychologist, forensic for [employer]   3 children-twins fraternal (son & daughter age 21), son 52    Past Surgical History:  Procedure Laterality Date  . KNEE SURGERY Right     Family History  Problem Relation Age of Onset  . Ulcers Mother   . Hypertension Maternal Grandmother   . Stroke Maternal Grandfather   . Ovarian cancer Paternal Aunt  Allergies  Allergen Reactions  . Tetanus Toxoids Swelling    Lip swelling  . Percocet [Oxycodone-Acetaminophen] Nausea And Vomiting    Current Outpatient Medications on File Prior to Visit  Medication Sig Dispense Refill  . desloratadine (CLARINEX) 5 MG tablet Take 1 tablet (5 mg total) by mouth daily. 90 tablet 3  . losartan (COZAAR) 100 MG tablet Take 1 tablet (100 mg total) by mouth daily. 90 tablet 3  . triamcinolone ointment (KENALOG) 0.5 % Apply 1 application topically 2 (two) times daily. 30 g 0  . fluticasone (FLONASE) 50 MCG/ACT nasal spray Place 2 sprays into both nostrils daily. 16 g 5   No current facility-administered medications on file prior to visit.     BP (!) 150/110   Temp 98.5 F (36.9 C) (Oral)   Ht 5' 6.5" (1.689 m)   Wt 231 lb (104.8 kg)   BMI 36.73 kg/m       Objective:   Physical Exam  Constitutional: He is oriented to person, place, and time. He appears well-developed and  well-nourished. No distress.  Cardiovascular: Normal rate, regular rhythm, normal heart sounds and intact distal pulses.  Pulmonary/Chest: Effort normal and breath sounds normal.  Musculoskeletal: Normal range of motion.  Neurological: He is alert and oriented to person, place, and time.  Skin: Skin is warm and dry. He is not diaphoretic.  Psychiatric: He has a normal mood and affect. His behavior is normal. Judgment and thought content normal.  Nursing note and vitals reviewed.      Assessment & Plan:  1. Essential hypertension - Continues to be elevated. Will add in Norvasc 5 mg. Stressed the importance of diet and exercise, he needs to lose weight.  - amLODipine (NORVASC) 5 MG tablet; Take 1 tablet (5 mg total) by mouth daily.  Dispense: 30 tablet; Refill: 1 - Will follow up with me via Mychart in 2 weeks   2. Elevated alkaline phosphatase level  - Hepatic function panel - Gamma GT   Dorothyann Peng, NP

## 2018-11-01 ENCOUNTER — Other Ambulatory Visit: Payer: Self-pay | Admitting: Adult Health

## 2018-11-01 DIAGNOSIS — L409 Psoriasis, unspecified: Secondary | ICD-10-CM

## 2019-04-28 ENCOUNTER — Other Ambulatory Visit: Payer: Self-pay | Admitting: Adult Health

## 2019-04-28 DIAGNOSIS — I10 Essential (primary) hypertension: Secondary | ICD-10-CM

## 2019-06-10 ENCOUNTER — Other Ambulatory Visit: Payer: Self-pay | Admitting: Adult Health

## 2019-06-10 DIAGNOSIS — I10 Essential (primary) hypertension: Secondary | ICD-10-CM

## 2019-06-11 NOTE — Telephone Encounter (Signed)
Denied.  Needs cpx and fasting lab work.

## 2019-06-20 ENCOUNTER — Ambulatory Visit (INDEPENDENT_AMBULATORY_CARE_PROVIDER_SITE_OTHER): Payer: BC Managed Care – PPO

## 2019-06-20 ENCOUNTER — Other Ambulatory Visit: Payer: Self-pay

## 2019-06-20 DIAGNOSIS — Z23 Encounter for immunization: Secondary | ICD-10-CM | POA: Diagnosis not present

## 2019-06-27 DIAGNOSIS — Z9189 Other specified personal risk factors, not elsewhere classified: Secondary | ICD-10-CM | POA: Diagnosis not present

## 2019-06-27 DIAGNOSIS — Z20828 Contact with and (suspected) exposure to other viral communicable diseases: Secondary | ICD-10-CM | POA: Diagnosis not present

## 2019-07-02 DIAGNOSIS — Z20828 Contact with and (suspected) exposure to other viral communicable diseases: Secondary | ICD-10-CM | POA: Diagnosis not present

## 2020-08-18 ENCOUNTER — Ambulatory Visit: Payer: BC Managed Care – PPO | Admitting: Dermatology

## 2020-08-29 ENCOUNTER — Ambulatory Visit: Payer: Self-pay | Admitting: Dermatology

## 2020-09-05 ENCOUNTER — Encounter: Payer: Self-pay | Admitting: Dermatology

## 2020-09-05 ENCOUNTER — Other Ambulatory Visit: Payer: Self-pay

## 2020-09-05 ENCOUNTER — Ambulatory Visit (INDEPENDENT_AMBULATORY_CARE_PROVIDER_SITE_OTHER): Payer: BC Managed Care – PPO | Admitting: Dermatology

## 2020-09-05 VITALS — BP 130/90 | Wt 220.0 lb

## 2020-09-05 DIAGNOSIS — Z79899 Other long term (current) drug therapy: Secondary | ICD-10-CM

## 2020-09-05 DIAGNOSIS — R5383 Other fatigue: Secondary | ICD-10-CM

## 2020-09-05 DIAGNOSIS — L918 Other hypertrophic disorders of the skin: Secondary | ICD-10-CM | POA: Diagnosis not present

## 2020-09-05 DIAGNOSIS — L400 Psoriasis vulgaris: Secondary | ICD-10-CM

## 2020-09-05 NOTE — Progress Notes (Signed)
bp 130/90 wt 220 Pt does drink alocohol

## 2020-09-06 ENCOUNTER — Encounter: Payer: Self-pay | Admitting: Dermatology

## 2020-09-06 NOTE — Progress Notes (Signed)
   New patient visit   Subjective  Samuel Lyons is a 50 y.o. male who presents for the following: Psoriasis (Flare Scalp and legs buttocks , right lower flank abdomen , moles or tags on neck).  Psoriasis Location: All over Duration:  Quality: Spreading Associated Signs/Symptoms: Itching and negatively impacting quality of life Modifying Factors:  Severity:  Timing: Context:   Objective  Well appearing patient in no apparent distress; mood and affect are within normal limits. Objective  Chest - Medial Samuel Lyons), Left Hip (side) - Posterior, Left Thigh - Anterior, Right Hip (side) - Posterior, Right Thigh - Anterior, Scalp: Roughly 40 to 50% body surface area plaques with thick lesions in scalp face neck extremities.  Objective  Left Breast, Neck - Anterior: Pedunculated 2 mm papules    A full examination was performed including scalp, head, eyes, ears, nose, lips, neck, chest, axillae, abdomen, back, buttocks, bilateral upper extremities, bilateral lower extremities, hands, feet, fingers, toes, fingernails, and toenails. All findings within normal limits unless otherwise noted below.   Assessment & Plan    Plaque psoriasis (6) Left Hip (side) - Posterior; Right Hip (side) - Posterior; Left Thigh - Anterior; Right Thigh - Anterior; Chest - Medial Samuel Lyons); Scalp  Patient will go to psoriasis.org do some research. Per Dr.Gaylan Lyons will start getting approval for skiyrizi.  Detail information first about the relationship of psoriasis to risk factors for adult metabolic disorder including type 2 diabetes, obesity, elevated cholesterol, hypertension, and cardiovascular disease.  Encouraged to maintain healthy lifestyle.  Essentially all treatment options reviewed, but I do feel that initial systemic therapy is indicated.  Social drinker and moderately overweight so would prefer to avoid methotrexate.  Other Related Procedures Comprehensive metabolic panel CBC with  Differential/Platelet Hepatitis B surface antibody,quantitative Hepatitis B surface antigen Hepatitis B core antibody, total Hepatitis C antibody QuantiFERON-TB Gold Plus ANA  Drug therapy  Other Related Procedures Comprehensive metabolic panel CBC with Differential/Platelet Hepatitis B surface antibody,quantitative Hepatitis B surface antigen Hepatitis B core antibody, total Hepatitis C antibody QuantiFERON-TB Gold Plus ANA  Fatigue, unspecified type  Other Related Procedures QuantiFERON-TB Gold Plus  Skin tags, multiple acquired (2) Neck - Anterior; Left Breast  May choose to schedule removal in future.      I, Samuel Monarch, MD, have reviewed all documentation for this visit.  The documentation on 09/06/20 for the exam, diagnosis, procedures, and orders are all accurate and complete.

## 2020-09-07 LAB — CBC WITH DIFFERENTIAL/PLATELET
Absolute Monocytes: 370 cells/uL (ref 200–950)
Basophils Absolute: 39 cells/uL (ref 0–200)
Basophils Relative: 0.9 %
Eosinophils Absolute: 370 cells/uL (ref 15–500)
Eosinophils Relative: 8.6 %
HCT: 47.5 % (ref 38.5–50.0)
Hemoglobin: 15.7 g/dL (ref 13.2–17.1)
Lymphs Abs: 1677 cells/uL (ref 850–3900)
MCH: 26 pg — ABNORMAL LOW (ref 27.0–33.0)
MCHC: 33.1 g/dL (ref 32.0–36.0)
MCV: 78.6 fL — ABNORMAL LOW (ref 80.0–100.0)
MPV: 11 fL (ref 7.5–12.5)
Monocytes Relative: 8.6 %
Neutro Abs: 1845 cells/uL (ref 1500–7800)
Neutrophils Relative %: 42.9 %
Platelets: 241 10*3/uL (ref 140–400)
RBC: 6.04 10*6/uL — ABNORMAL HIGH (ref 4.20–5.80)
RDW: 14.1 % (ref 11.0–15.0)
Total Lymphocyte: 39 %
WBC: 4.3 10*3/uL (ref 3.8–10.8)

## 2020-09-07 LAB — QUANTIFERON-TB GOLD PLUS
Mitogen-NIL: >10 [IU]/mL
NIL: 0.02 [IU]/mL
QuantiFERON-TB Gold Plus: NEGATIVE
TB1-NIL: <0 [IU]/mL
TB2-NIL: 0 [IU]/mL

## 2020-09-07 LAB — COMPREHENSIVE METABOLIC PANEL WITH GFR
AG Ratio: 1.7 (calc) (ref 1.0–2.5)
ALT: 75 U/L — ABNORMAL HIGH (ref 9–46)
AST: 40 U/L (ref 10–40)
Albumin: 4.3 g/dL (ref 3.6–5.1)
Alkaline phosphatase (APISO): 173 U/L — ABNORMAL HIGH (ref 36–130)
BUN: 9 mg/dL (ref 7–25)
CO2: 27 mmol/L (ref 20–32)
Calcium: 9.6 mg/dL (ref 8.6–10.3)
Chloride: 102 mmol/L (ref 98–110)
Creat: 1.14 mg/dL (ref 0.60–1.35)
Globulin: 2.6 g/dL (ref 1.9–3.7)
Glucose, Bld: 93 mg/dL (ref 65–99)
Potassium: 4.5 mmol/L (ref 3.5–5.3)
Sodium: 139 mmol/L (ref 135–146)
Total Bilirubin: 0.4 mg/dL (ref 0.2–1.2)
Total Protein: 6.9 g/dL (ref 6.1–8.1)

## 2020-09-07 LAB — HEPATITIS B SURFACE ANTIGEN: Hepatitis B Surface Ag: NONREACTIVE

## 2020-09-07 LAB — HEPATITIS C ANTIBODY
Hepatitis C Ab: NONREACTIVE
SIGNAL TO CUT-OFF: 0.01 (ref <1.00)

## 2020-09-07 LAB — HEPATITIS B CORE ANTIBODY, TOTAL: Hep B Core Total Ab: NONREACTIVE

## 2020-09-07 LAB — HEPATITIS B SURFACE ANTIBODY, QUANTITATIVE: Hep B S AB Quant (Post): <5 m[IU]/mL — ABNORMAL LOW

## 2020-09-07 LAB — ANA: Anti Nuclear Antibody (ANA): NEGATIVE

## 2020-09-27 ENCOUNTER — Telehealth: Payer: Self-pay | Admitting: *Deleted

## 2020-09-27 NOTE — Telephone Encounter (Signed)
Checked status of drug per [facility]- need prior failed drugs- faxed failed therapy to [facility] @ [phone]

## 2020-10-06 ENCOUNTER — Telehealth: Payer: Self-pay | Admitting: Dermatology

## 2020-10-06 NOTE — Telephone Encounter (Signed)
Phone call to patient to inform him that once we receive the denial we will get the appeal process started for his Skyrizi.  Patient aware and understands.

## 2020-10-06 NOTE — Telephone Encounter (Signed)
Got letter from [facility] saying Samuel Lyons was denied. Wants to know if this will be appealed.

## 2020-10-10 ENCOUNTER — Telehealth: Payer: Self-pay

## 2020-10-10 NOTE — Telephone Encounter (Signed)
Spoke with April through the [facility] portal to see why the patient's Samuel Lyons was denied.  Per April with Skyrizi a prior auth reconsideration for Samuel Lyons was submitted 10/04/2020 and is currently pending.

## 2020-10-11 ENCOUNTER — Telehealth: Payer: Self-pay

## 2020-10-11 MED ORDER — SKYRIZI (150 MG DOSE) 75 MG/0.83ML ~~LOC~~ PSKT: 150.0000 mg | PREFILLED_SYRINGE | SUBCUTANEOUS | 3 refills | Status: DC

## 2020-10-11 MED ORDER — SKYRIZI (150 MG DOSE) 75 MG/0.83ML ~~LOC~~ PSKT: 150.0000 mg | PREFILLED_SYRINGE | SUBCUTANEOUS | 0 refills | Status: DC

## 2020-10-11 NOTE — Telephone Encounter (Signed)
Per Iantha Fallen we must change to [facility] for skyrizi

## 2020-10-13 ENCOUNTER — Ambulatory Visit: Payer: BC Managed Care – PPO | Admitting: Dermatology

## 2020-11-07 ENCOUNTER — Ambulatory Visit: Payer: Self-pay | Admitting: Dermatology

## 2020-12-05 ENCOUNTER — Telehealth: Payer: Self-pay | Admitting: *Deleted

## 2020-12-05 NOTE — Telephone Encounter (Signed)
Patient called wanting to know status of skyrizi- informed patient that per [facility] he must use [facility]. Patient is calling [facility] to check status/schedule delivery.

## 2021-01-10 ENCOUNTER — Telehealth: Payer: Self-pay | Admitting: Dermatology

## 2021-01-10 NOTE — Telephone Encounter (Signed)
Had Skyrizi shot on May 3rd, needs to know when he's due for another shot + what the procedure is for getting it. [facility] told him he only had an Rx for one shot. Can leave message on voice mail.

## 2021-01-10 NOTE — Telephone Encounter (Signed)
Left message for patient to return our phone call.

## 2021-01-11 NOTE — Telephone Encounter (Signed)
[place] office

## 2021-01-16 ENCOUNTER — Telehealth: Payer: Self-pay | Admitting: Dermatology

## 2021-01-16 NOTE — Telephone Encounter (Signed)
Patient left message on office voice mail saying that he took his Skyrizi shot on 12/13/2020 and wants to know when his next shot should be and what does he need to do from this point?

## 2021-01-16 NOTE — Telephone Encounter (Signed)
Left message for patient to call us back.  

## 2021-01-17 ENCOUNTER — Telehealth: Payer: Self-pay | Admitting: Dermatology

## 2021-01-17 NOTE — Telephone Encounter (Signed)
Left message for patient to call us back.  

## 2021-01-17 NOTE — Telephone Encounter (Signed)
Phone call from patient returning our call regarding his next injection of his Skyrizi. Patient states that [facility] only sent him one injection. I informed patient that we sent in two injections for his initial dose and we also sent in his maintenance dose. Patient states he will call [facility] and see why they didn't send him the injection the way we sent it to them.

## 2021-01-17 NOTE — Telephone Encounter (Signed)
Wants to know when next time is that he has to get an Rx for [employer]. Had 1 injection but was told he would have to have more than 1; the pharmacy only had 1 set up and said it would have to be renewed. Can leave a message.

## 2021-04-28 ENCOUNTER — Emergency Department (HOSPITAL_BASED_OUTPATIENT_CLINIC_OR_DEPARTMENT_OTHER)
Admission: EM | Admit: 2021-04-28 | Discharge: 2021-04-28 | Disposition: A | Discharge status: Home / Self Care | Payer: BC Managed Care – PPO | Attending: Emergency Medicine | Admitting: Emergency Medicine

## 2021-04-28 ENCOUNTER — Other Ambulatory Visit: Payer: Self-pay

## 2021-04-28 ENCOUNTER — Encounter (HOSPITAL_BASED_OUTPATIENT_CLINIC_OR_DEPARTMENT_OTHER): Payer: Self-pay | Admitting: *Deleted

## 2021-04-28 DIAGNOSIS — K0889 Other specified disorders of teeth and supporting structures: Secondary | ICD-10-CM

## 2021-04-28 DIAGNOSIS — R030 Elevated blood-pressure reading, without diagnosis of hypertension: Secondary | ICD-10-CM | POA: Diagnosis not present

## 2021-04-28 DIAGNOSIS — I10 Essential (primary) hypertension: Secondary | ICD-10-CM | POA: Insufficient documentation

## 2021-04-28 MED ORDER — KETOROLAC TROMETHAMINE 30 MG/ML IJ SOLN
30.0000 mg | Freq: Once | INTRAMUSCULAR | Status: AC
  Administered 2021-04-28: 30 mg via INTRAMUSCULAR
  Filled 2021-04-28: qty 1

## 2021-04-28 MED ORDER — LOSARTAN POTASSIUM 100 MG PO TABS: 100.0000 mg | ORAL_TABLET | Freq: Every day | ORAL | 0 refills | Status: DC

## 2021-04-28 MED ORDER — NAPROXEN 250 MG PO TABS: 500.0000 mg | ORAL_TABLET | Freq: Two times a day (BID) | ORAL | 0 refills | Status: DC | PRN

## 2021-04-28 NOTE — Discharge Instructions (Addendum)
Recommend restarting your prior blood pressure regimen.  Continue antibiotic as prescribed.  Recommend anti-inflammatory such as the prescribed naproxen as needed for pain.  Follow-up with your primary doctor for recheck of your blood pressure.  Follow-up with your dentist for management of your tooth.

## 2021-04-28 NOTE — ED Triage Notes (Signed)
Pt went to dentist to have broken Wisdom Tooth removed when they checked his bp several times last bp 182/125

## 2021-04-29 NOTE — ED Provider Notes (Signed)
[facility] EMERGENCY DEPT Provider Note   CSN: [id number] Arrival date & time: 04/28/21  1024     History Chief Complaint  Patient presents with   Hypertension   Wisdom Tooth pain    Samuel Lyons is a 50 y.o. male.  Presents to ER with concern for elevated blood pressure.  Patient reports that he was at his dentist office today for a tooth removal when he was noted to have elevated blood pressure.  He denies any associated symptoms with the elevated blood pressure.  Specifically denies any headache, lightheadedness, chest pain, difficulty breathing.  Patient reports that he has a history of high blood pressure and was previously on losartan.  However according to the patient his primary doctor had taken him off of this medication because his numbers were so improved.  Patient does not regularly check blood pressure.  He reports that he is currently on amoxicillin by his dentist.    HPI     Past Medical History:  Diagnosis Date   Allergic rhinitis    Essential hypertension    Hyperlipidemia    Psoriasis    Thrombocytosis     Patient Active Problem List   Diagnosis Date Noted   Elevated alkaline phosphatase level 04/29/2018   Hyperlipidemia    Allergic rhinitis 11/20/2013   Hypertension 11/20/2013   Preventative health care 09/02/2012   Psoriasis 09/02/2012   Thrombocytosis 09/02/2012    Past Surgical History:  Procedure Laterality Date   KNEE SURGERY Right        Family History  Problem Relation Age of Onset   Ulcers Mother    Hypertension Maternal Grandmother    Stroke Maternal Grandfather    Ovarian cancer Paternal Aunt     Social History   Tobacco Use   Smoking status: Never   Smokeless tobacco: Never  Vaping Use   Vaping Use: Never used  Substance Use Topics   Alcohol use: Yes   Drug use: No    Home Medications Prior to Admission medications   Medication Sig Start Date End Date Taking? Authorizing Provider  naproxen  (NAPROSYN) 250 MG tablet Take 2 tablets (500 mg total) by mouth 2 (two) times daily as needed. 04/28/21  Yes Lucrezia Starch, MD  amLODipine (NORVASC) 5 MG tablet Take 1 tablet (5 mg total) by mouth daily. Patient not taking: Reported on 09/05/2020 04/29/18   Dorothyann Peng, NP  desloratadine (CLARINEX) 5 MG tablet Take 1 tablet (5 mg total) by mouth daily. 02/06/17   Nafziger, Tommi Rumps, NP  fluticasone (FLONASE) 50 MCG/ACT nasal spray Place 2 sprays into both nostrils daily. 11/23/15 11/22/16  Nafziger, Tommi Rumps, NP  losartan (COZAAR) 100 MG tablet Take 1 tablet (100 mg total) by mouth daily. 04/28/21   Lucrezia Starch, MD  Risankizumab-rzaa,150 MG Dose, (SKYRIZI, 150 MG DOSE,) 75 MG/0.83ML PSKT Inject 150 mg into the skin as directed. At weeks 0 & 4. 10/11/20   Lavonna Monarch, MD  Risankizumab-rzaa,150 MG Dose, (SKYRIZI, 150 MG DOSE,) 75 MG/0.83ML PSKT Inject 150 mg into the skin as directed. Every 12 weeks for maintenance. 10/11/20   Lavonna Monarch, MD  triamcinolone ointment (KENALOG) 0.5 % APPLY TO AFFECTED AREA(S) TWO TIMES A DAY Patient not taking: Reported on 09/05/2020 11/04/18   Dorothyann Peng, NP    Allergies    Tetanus toxoids and Percocet [oxycodone-acetaminophen]  Review of Systems   Review of Systems  Constitutional:  Negative for chills and fever.  HENT:  Positive for dental problem. Negative  for ear pain and sore throat.   Eyes:  Negative for pain and visual disturbance.  Respiratory:  Negative for cough and shortness of breath.   Cardiovascular:  Negative for chest pain and palpitations.  Gastrointestinal:  Negative for abdominal pain and vomiting.  Genitourinary:  Negative for dysuria and hematuria.  Musculoskeletal:  Negative for arthralgias and back pain.  Skin:  Negative for color change and rash.  Neurological:  Negative for seizures and syncope.  All other systems reviewed and are negative.  Physical Exam Updated Vital Signs BP (!) 174/101   Pulse 78   Temp 98.3 F (36.8 C)    Resp 15   Ht 5' 7.5" (1.715 m)   Wt 104.3 kg   SpO2 97%   BMI 35.49 kg/m   Physical Exam Vitals and nursing note reviewed.  Constitutional:      Appearance: He is well-developed.  HENT:     Head: Normocephalic and atraumatic.     Mouth/Throat:     Comments: Right lower molar region has multiple dental cavities, no abscess or significant erythema appreciated Eyes:     Conjunctiva/sclera: Conjunctivae normal.  Cardiovascular:     Rate and Rhythm: Normal rate.     Pulses: Normal pulses.  Pulmonary:     Effort: Pulmonary effort is normal. No respiratory distress.  Abdominal:     Palpations: Abdomen is soft.     Tenderness: There is no abdominal tenderness.  Musculoskeletal:     Cervical back: Neck supple.  Skin:    General: Skin is warm and dry.  Neurological:     General: No focal deficit present.     Mental Status: He is alert.  Psychiatric:        Mood and Affect: Mood normal.    ED Results / Procedures / Treatments   Labs (all labs ordered are listed, but only abnormal results are displayed) Labs Reviewed - No data to display  EKG None  Radiology No results found.  Procedures Procedures   Medications Ordered in ED Medications  ketorolac (TORADOL) 30 MG/ML injection 30 mg (30 mg Intramuscular Given 04/28/21 1253)    ED Course  I have reviewed the triage vital signs and the nursing notes.  Pertinent labs & imaging results that were available during my care of the patient were reviewed by me and considered in my medical decision making (see chart for details).    MDM Rules/Calculators/A&P                           50 year old male presents to ER with concern for dental pain and elevated blood pressure.  Regarding that elevated blood pressure, suspect this was incidental finding at dentistry office today.  Patient does not have any associated symptoms to suggest acute hypertensive crisis.  Given patient was previously prescribed losartan, will give Rx  for this and recommend he start taking it and follow-up with his primary doctor next week for BP recheck and further management of blood pressure.  Regarding dental pain, recommended continuing the antibiotics and following up with dentistry.    After the discussed management above, the patient was determined to be safe for discharge.  The patient was in agreement with this plan and all questions regarding their care were answered.  ED return precautions were discussed and the patient will return to the ED with any significant worsening of condition.  Final Clinical Impression(s) / ED Diagnoses Final diagnoses:  Essential hypertension  Pain, dental    Rx / DC Orders ED Discharge Orders          Ordered    losartan (COZAAR) 100 MG tablet  Daily       Note to Pharmacy: Patient needs an OV for further refills.   04/28/21 1227    naproxen (NAPROSYN) 250 MG tablet  2 times daily PRN        04/28/21 1230             Lucrezia Starch, MD 04/29/21 [phone]

## 2021-05-04 ENCOUNTER — Ambulatory Visit: Payer: BC Managed Care – PPO | Admitting: Adult Health

## 2021-05-16 ENCOUNTER — Other Ambulatory Visit: Payer: Self-pay

## 2021-05-16 ENCOUNTER — Ambulatory Visit (INDEPENDENT_AMBULATORY_CARE_PROVIDER_SITE_OTHER): Payer: BC Managed Care – PPO | Admitting: Adult Health

## 2021-05-16 ENCOUNTER — Encounter: Payer: Self-pay | Admitting: Adult Health

## 2021-05-16 VITALS — BP 148/100 | HR 82 | Temp 99.1°F | Ht 67.5 in | Wt 231.0 lb

## 2021-05-16 DIAGNOSIS — K0889 Other specified disorders of teeth and supporting structures: Secondary | ICD-10-CM

## 2021-05-16 DIAGNOSIS — I10 Essential (primary) hypertension: Secondary | ICD-10-CM

## 2021-05-16 MED ORDER — NAPROXEN 250 MG PO TABS: 500.0000 mg | ORAL_TABLET | Freq: Two times a day (BID) | ORAL | 0 refills | Status: AC | PRN

## 2021-05-16 MED ORDER — LOSARTAN POTASSIUM 100 MG PO TABS
100.0000 mg | ORAL_TABLET | Freq: Every day | ORAL | 0 refills | Status: DC
Start: 2021-05-16 — End: 2021-11-09

## 2021-05-16 MED ORDER — AMLODIPINE BESYLATE 5 MG PO TABS: 5.0000 mg | ORAL_TABLET | Freq: Every day | ORAL | 0 refills | Status: DC

## 2021-05-16 NOTE — Progress Notes (Signed)
Subjective:    Patient ID: Samuel Lyons, male    DOB: [birth year], 50 y.o.   MRN: [id number]  HPI 50 year old male who  has a past medical history of Allergic rhinitis, Essential hypertension, Hyperlipidemia, Psoriasis, and Thrombocytosis.  He presents to the office today for follow up regarding hypertension. He has not been seen in the office since 2019.   He was seen at the oral surgeon recently for wisdom tooth extraction.  He was not able to have his wisdom tooth surgery done due to elevated blood pressure, he reports in the 210s over 100s.  Was not experiencing any signs or symptoms of hypertension at this time.  His surgery was canceled, he then went to the emergency room and losartan was represcribed.  In the past he was also on Norvasc but has not been taking this since 2019.  He needs a letter from me stating that his blood pressure is under better control.  BP Readings from Last 3 Encounters:  05/16/21 (!) 148/100  04/28/21 (!) 174/101  09/05/20 130/90   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[employer]  Tobacco Use   Smoking status: Never   Smokeless tobacco: Never  Vaping Use   Vaping Use: Never used  Substance and Sexual Activity   Alcohol use: Yes   Drug use: No   Sexual activity: Not on file  Other Topics Concern   Not on file  Social History Narrative   Married for 7 years- wife's name is Education officer, community up in [place]   Works as a Psychologist, forensic for [employer]   3 children-twins fraternal (son & daughter age 45), son 38   Social  Determinants of Radio broadcast assistant Strain: Not on file  Food Insecurity: Not on file  Transportation Needs: Not on file  Physical Activity: Not on file  Stress: Not on file  Social Connections: Not on file  Intimate Partner Violence: Not on file    Past Surgical History:  Procedure Laterality Date   KNEE SURGERY Right     Family History  Problem Relation Age of Onset   Ulcers Mother    Hypertension Maternal Grandmother    Stroke Maternal Grandfather    Ovarian cancer Paternal Aunt     Allergies  Allergen Reactions   Tetanus Toxoids Swelling    Lip swelling   Percocet [Oxycodone-Acetaminophen] Nausea And Vomiting    Current Outpatient Medications on File Prior to Visit  Medication Sig Dispense Refill   desloratadine (CLARINEX) 5 MG tablet Take 1 tablet (5 mg total) by mouth daily. 90 tablet 3   No current facility-administered medications on file prior to visit.         Objective:   Physical Exam Vitals and nursing note reviewed.  Constitutional:      Appearance: Normal appearance.  Cardiovascular:     Rate and Rhythm: Normal rate and regular rhythm.     Pulses: Normal pulses.  Heart sounds: Normal heart sounds.  Pulmonary:     Effort: Pulmonary effort is normal.     Breath sounds: Normal breath sounds.  Musculoskeletal:        General: Normal range of motion.  Skin:    General: Skin is warm and dry.  Neurological:     General: No focal deficit present.     Mental Status: He is alert and oriented to person, place, and time.  Psychiatric:        Mood and Affect: Mood normal.        Behavior: Behavior normal.        Thought Content: Thought content normal.      Assessment & Plan:  1. Essential hypertension - Follow up for CPE  - Letter written. BP will improve once he starts taking both of his medications  - losartan (COZAAR) 100 MG tablet; Take 1 tablet (100 mg total) by mouth daily.  Dispense: 90 tablet; Refill: 0 - amLODipine  (NORVASC) 5 MG tablet; Take 1 tablet (5 mg total) by mouth daily.  Dispense: 90 tablet; Refill: 0  2. Pain, dental - Needs refill  - naproxen (NAPROSYN) 250 MG tablet; Take 2 tablets (500 mg total) by mouth 2 (two) times daily as needed.  Dispense: 60 tablet; Refill: 0  Dorothyann Peng, NP

## 2021-05-16 NOTE — Patient Instructions (Signed)
It was great seeing you again  I have sent in both of your blood pressure medications   Please follow up in the next 90 days

## 2021-05-25 ENCOUNTER — Encounter: Payer: BC Managed Care – PPO | Admitting: Adult Health

## 2021-06-08 ENCOUNTER — Telehealth: Payer: Self-pay | Admitting: Adult Health

## 2021-06-08 ENCOUNTER — Other Ambulatory Visit: Payer: Self-pay

## 2021-06-08 ENCOUNTER — Encounter: Payer: Self-pay | Admitting: Adult Health

## 2021-06-08 ENCOUNTER — Ambulatory Visit (INDEPENDENT_AMBULATORY_CARE_PROVIDER_SITE_OTHER): Payer: BC Managed Care – PPO | Admitting: Adult Health

## 2021-06-08 VITALS — BP 138/98 | HR 95 | Temp 98.6°F | Ht 67.75 in | Wt 228.0 lb

## 2021-06-08 DIAGNOSIS — Z0000 Encounter for general adult medical examination without abnormal findings: Secondary | ICD-10-CM

## 2021-06-08 DIAGNOSIS — Z125 Encounter for screening for malignant neoplasm of prostate: Secondary | ICD-10-CM

## 2021-06-08 DIAGNOSIS — E782 Mixed hyperlipidemia: Secondary | ICD-10-CM

## 2021-06-08 DIAGNOSIS — I10 Essential (primary) hypertension: Secondary | ICD-10-CM | POA: Diagnosis not present

## 2021-06-08 DIAGNOSIS — Z1211 Encounter for screening for malignant neoplasm of colon: Secondary | ICD-10-CM | POA: Diagnosis not present

## 2021-06-08 LAB — CBC WITH DIFFERENTIAL/PLATELET
Basophils Absolute: 0 10*3/uL (ref 0.0–0.1)
Basophils Relative: 0.6 % (ref 0.0–3.0)
Eosinophils Absolute: 0.2 10*3/uL (ref 0.0–0.7)
Eosinophils Relative: 2.9 % (ref 0.0–5.0)
HCT: 43.5 % (ref 39.0–52.0)
Hemoglobin: 14.1 g/dL (ref 13.0–17.0)
Lymphocytes Relative: 34.6 % (ref 12.0–46.0)
Lymphs Abs: 1.8 10*3/uL (ref 0.7–4.0)
MCHC: 32.4 g/dL (ref 30.0–36.0)
MCV: 80.1 fl (ref 78.0–100.0)
Monocytes Absolute: 0.3 10*3/uL (ref 0.1–1.0)
Monocytes Relative: 6.5 % (ref 3.0–12.0)
Neutro Abs: 2.9 10*3/uL (ref 1.4–7.7)
Neutrophils Relative %: 55.4 % (ref 43.0–77.0)
Platelets: 259 10*3/uL (ref 150.0–400.0)
RBC: 5.43 Mil/uL (ref 4.22–5.81)
RDW: 14.3 % (ref 11.5–15.5)
WBC: 5.3 10*3/uL (ref 4.0–10.5)

## 2021-06-08 LAB — LIPID PANEL
Cholesterol: 229 mg/dL — ABNORMAL HIGH (ref 0–200)
HDL: 42.8 mg/dL (ref >39.00)
LDL Cholesterol: 154 mg/dL — ABNORMAL HIGH (ref 0–99)
NonHDL: 185.99
Total CHOL/HDL Ratio: 5
Triglycerides: 161 mg/dL — ABNORMAL HIGH (ref 0.0–149.0)
VLDL: 32.2 mg/dL (ref 0.0–40.0)

## 2021-06-08 LAB — COMPREHENSIVE METABOLIC PANEL
ALT: 57 U/L — ABNORMAL HIGH (ref 0–53)
AST: 32 U/L (ref 0–37)
Albumin: 4.5 g/dL (ref 3.5–5.2)
Alkaline Phosphatase: 170 U/L — ABNORMAL HIGH (ref 39–117)
BUN: 11 mg/dL (ref 6–23)
CO2: 29 mEq/L (ref 19–32)
Calcium: 9.7 mg/dL (ref 8.4–10.5)
Chloride: 100 mEq/L (ref 96–112)
Creatinine, Ser: 1.2 mg/dL (ref 0.40–1.50)
GFR: 71.84 mL/min (ref >60.00)
Glucose, Bld: 89 mg/dL (ref 70–99)
Potassium: 4.4 mEq/L (ref 3.5–5.1)
Sodium: 138 mEq/L (ref 135–145)
Total Bilirubin: 0.4 mg/dL (ref 0.2–1.2)
Total Protein: 7.2 g/dL (ref 6.0–8.3)

## 2021-06-08 LAB — PSA: PSA: 0.78 ng/mL (ref 0.10–4.00)

## 2021-06-08 LAB — TSH: TSH: 1.68 u[IU]/mL (ref 0.35–5.50)

## 2021-06-08 LAB — HEMOGLOBIN A1C: Hgb A1c MFr Bld: 6.3 % (ref 4.6–6.5)

## 2021-06-08 MED ORDER — ROSUVASTATIN CALCIUM 10 MG PO TABS: 10.0000 mg | ORAL_TABLET | Freq: Every day | ORAL | 3 refills | Status: DC

## 2021-06-08 NOTE — Telephone Encounter (Signed)
Updated patient on labs.   Cholesterol panel has increased as we last checked 3 years ago and liver enzyme is also slightly elevated.  Will start on Crestor and milligrams and recheck in 6 months.  He was encouraged to work on lifestyle modifications.

## 2021-06-08 NOTE — Patient Instructions (Addendum)
It was great seeing you today   We will follow up with you labs   Someone will call you to schedule your colonoscopy.    I want to see you back in two weeks to see how you are doing with your blood pressure. Bring your cuff with you

## 2021-06-08 NOTE — Addendum Note (Signed)
Addended by: Amanda Cockayne on: 06/08/2021 07:38 AM   Modules accepted: Orders

## 2021-06-08 NOTE — Progress Notes (Signed)
Subjective:    Patient ID: Samuel Lyons, male    DOB: 29-Nov-1970, 50 y.o.   MRN: [id number]  HPI Patient presents for yearly preventative medicine examination. He is a pleasant 50 year old male who  has a past medical history of Allergic rhinitis, Essential hypertension, Hyperlipidemia, Psoriasis, and Thrombocytosis.  His last CPE was in 03/2018   HTN -managed with Cozaar 100 mg daily and Norvasc 5 mg daily.  Denies dizziness, lightheadedness, chest pain, shortness of breath.  BP Readings from Last 3 Encounters:  06/08/21 (!) 138/98  05/16/21 (!) 148/100  04/28/21 (!) 174/101   Hyperlipidemia - Not currently on statin  Lab Results  Component Value Date   CHOL 206 (H) 04/11/2018   HDL 36.90 (L) 04/11/2018   LDLCALC 139 (H) 04/11/2018   TRIG 150.0 (H) 04/11/2018   CHOLHDL 6 04/11/2018   All immunizations and health maintenance protocols were reviewed with the patient and needed orders were placed.  Appropriate screening laboratory values were ordered for the patient including screening of hyperlipidemia, renal function and hepatic function. If indicated by BPH, a PSA was ordered.  Medication reconciliation,  past medical history, social history, problem list and allergies were reviewed in detail with the patient  Goals were established with regard to weight loss, exercise, and  diet in compliance with medications Wt Readings from Last 3 Encounters:  06/08/21 228 lb (103.4 kg)  05/16/21 231 lb (104.8 kg)  04/28/21 230 lb (104.3 kg)   Review of Systems  Constitutional: Negative.   HENT: Negative.    Eyes: Negative.   Respiratory: Negative.    Cardiovascular: Negative.   Gastrointestinal: Negative.   Endocrine: Negative.   Genitourinary: Negative.   Musculoskeletal: Negative.   Skin: Negative.   Allergic/Immunologic: Negative.   Neurological: Negative.   Hematological: Negative.   Psychiatric/Behavioral: Negative.    All other systems reviewed and are  negative.  Past Medical History:  Diagnosis Date   Allergic rhinitis    Essential hypertension    Hyperlipidemia    Psoriasis    Thrombocytosis     Social History   Socioeconomic History   Marital status: Single    Spouse name: Not on file   Number of children: Not on file   Years of education: Not on file   Highest education level: Not on file  Occupational History   Occupation: Public relations account executive: [employer]  Tobacco Use   Smoking status: Never   Smokeless tobacco: Never  Vaping Use   Vaping Use: Never used  Substance and Sexual Activity   Alcohol use: Yes   Drug use: No   Sexual activity: Not on file  Other Topics Concern   Not on file  Social History Narrative   Married for 7 years- wife's name is Education officer, community up in [place]   Works as a Psychologist, forensic for [employer]   3 children-twins fraternal (son & daughter age 31), son 65   Social Determinants of Radio broadcast assistant Strain: Not on file  Food Insecurity: Not on file  Transportation Needs: Not on file  Physical Activity: Not on file  Stress: Not on file  Social Connections: Not on file  Intimate Partner Violence: Not on file    Past Surgical History:  Procedure Laterality Date   KNEE SURGERY Right     Family History  Problem Relation Age of Onset   Ulcers Mother    Hypertension Maternal Grandmother  Stroke Maternal Grandfather    Ovarian cancer Paternal Aunt     Allergies  Allergen Reactions   Tetanus Toxoids Swelling    Lip swelling   Percocet [Oxycodone-Acetaminophen] Nausea And Vomiting    Current Outpatient Medications on File Prior to Visit  Medication Sig Dispense Refill   amLODipine (NORVASC) 5 MG tablet Take 1 tablet (5 mg total) by mouth daily. 90 tablet 0   chlorhexidine (PERIDEX) 0.12 % solution SMARTSIG:By Mouth     desloratadine (CLARINEX) 5 MG tablet Take 1 tablet (5 mg total) by mouth daily. 90 tablet 3   losartan (COZAAR) 100 MG  tablet Take 1 tablet (100 mg total) by mouth daily. 90 tablet 0   naproxen (NAPROSYN) 250 MG tablet Take 2 tablets (500 mg total) by mouth 2 (two) times daily as needed. 60 tablet 0   No current facility-administered medications on file prior to visit.    BP (!) 138/98   Pulse 95   Temp 98.6 F (37 C) (Oral)   Ht 5' 7.75" (1.721 m)   Wt 228 lb (103.4 kg)   SpO2 97%   BMI 34.92 kg/m        Objective:   Physical Exam Vitals and nursing note reviewed.  Constitutional:      General: He is not in acute distress.    Appearance: Normal appearance. He is well-developed and normal weight.  HENT:     Head: Normocephalic and atraumatic.     Right Ear: Tympanic membrane, ear canal and external ear normal. There is no impacted cerumen.     Left Ear: Tympanic membrane, ear canal and external ear normal. There is no impacted cerumen.     Nose: Nose normal. No congestion or rhinorrhea.     Mouth/Throat:     Mouth: Mucous membranes are moist.     Pharynx: Oropharynx is clear. No oropharyngeal exudate or posterior oropharyngeal erythema.  Eyes:     General:        Right eye: No discharge.        Left eye: No discharge.     Extraocular Movements: Extraocular movements intact.     Conjunctiva/sclera: Conjunctivae normal.     Pupils: Pupils are equal, round, and reactive to light.  Neck:     Vascular: No carotid bruit.     Trachea: No tracheal deviation.  Cardiovascular:     Rate and Rhythm: Normal rate and regular rhythm.     Pulses: Normal pulses.     Heart sounds: Normal heart sounds. No murmur heard.   No friction rub. No gallop.  Pulmonary:     Effort: Pulmonary effort is normal. No respiratory distress.     Breath sounds: Normal breath sounds. No stridor. No wheezing, rhonchi or rales.  Chest:     Chest wall: No tenderness.  Abdominal:     General: Bowel sounds are normal. There is no distension.     Palpations: Abdomen is soft. There is no mass.     Tenderness: There is no  abdominal tenderness. There is no right CVA tenderness, left CVA tenderness, guarding or rebound.     Hernia: No hernia is present.  Musculoskeletal:        General: No swelling, tenderness, deformity or signs of injury. Normal range of motion.     Right lower leg: No edema.     Left lower leg: No edema.  Lymphadenopathy:     Cervical: No cervical adenopathy.  Skin:    General: Skin is  warm and dry.     Capillary Refill: Capillary refill takes less than 2 seconds.     Coloration: Skin is not jaundiced or pale.     Findings: No bruising, erythema, lesion or rash.  Neurological:     General: No focal deficit present.     Mental Status: He is alert and oriented to person, place, and time.     Cranial Nerves: No cranial nerve deficit.     Sensory: No sensory deficit.     Motor: No weakness.     Coordination: Coordination normal.     Gait: Gait normal.     Deep Tendon Reflexes: Reflexes normal.  Psychiatric:        Mood and Affect: Mood normal.        Behavior: Behavior normal.        Thought Content: Thought content normal.        Judgment: Judgment normal.      Assessment & Plan:  1. Routine general medical examination at a health care facility - Follow up in one year or sooner if needed  - CBC with Differential/Platelet; Future - Comprehensive metabolic panel; Future - Hemoglobin A1c; Future - Lipid panel; Future - TSH; Future  2. Essential hypertension -  BP better controlled. Will hold off on medication adjustment until after his dental procedure - Follow up in 2-3 weeks. Consider increase in Norvasc at that time.  - CBC with Differential/Platelet; Future - Comprehensive metabolic panel; Future - Hemoglobin A1c; Future - Lipid panel; Future - TSH; Future  3. Mixed hyperlipidemia - Consider statin  - CBC with Differential/Platelet; Future - Comprehensive metabolic panel; Future - Hemoglobin A1c; Future - Lipid panel; Future - TSH; Future  4. Colon cancer  screening  - Ambulatory referral to Gastroenterology  5. Prostate cancer screening  - PSA; Future  Dorothyann Peng, NP

## 2021-06-23 ENCOUNTER — Ambulatory Visit (INDEPENDENT_AMBULATORY_CARE_PROVIDER_SITE_OTHER): Payer: BC Managed Care – PPO | Admitting: Adult Health

## 2021-06-23 ENCOUNTER — Encounter: Payer: Self-pay | Admitting: Adult Health

## 2021-06-23 VITALS — BP 140/90 | HR 95 | Temp 98.5°F | Ht 67.75 in | Wt 228.0 lb

## 2021-06-23 DIAGNOSIS — I10 Essential (primary) hypertension: Secondary | ICD-10-CM

## 2021-06-23 MED ORDER — AMLODIPINE BESYLATE 10 MG PO TABS: 10.0000 mg | ORAL_TABLET | Freq: Every day | ORAL | 3 refills | Status: DC

## 2021-06-23 NOTE — Patient Instructions (Signed)
I am going to increase your Norvasc from 5 mg to 10 mg. You can take two of what you have at home. This should put your blood pressure into the 120's/80's.

## 2021-06-23 NOTE — Progress Notes (Signed)
Subjective:    Patient ID: Samuel Lyons, male    DOB: 05-11-1971, 50 y.o.   MRN: [id number]  HPI 50 year old male who  has a past medical history of Allergic rhinitis, Essential hypertension, Hyperlipidemia, Psoriasis, and Thrombocytosis.  He presents to the office today for 3-week follow-up regarding hypertension.  Currently managed with Cozaar 100 mg daily and Norvasc 5 mg daily.  He denies dizziness, lightheadedness, chest pain, or shortness of breath.  He has been monitoring her blood pressure at home and reports readings in the 130's140's/80-90's.    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[employer]  Tobacco Use   Smoking status: Never   Smokeless tobacco: Never  Vaping Use   Vaping Use: Never used  Substance and Sexual Activity   Alcohol use: Yes   Drug use: No   Sexual activity: Not on file  Other Topics Concern   Not on file  Social History Narrative   Married for 7 years- wife's name is Education officer, community up in [place]   Works as a Psychologist, forensic for [employer]   3 children-twins fraternal (son & daughter age 30), son 37   Social Determinants of Radio broadcast assistant Strain: Not on file  Food Insecurity: Not on file  Transportation Needs: Not on file  Physical Activity: Not on file  Stress: Not on file  Social Connections: Not on file  Intimate Partner Violence: Not on file    Past Surgical History:  Procedure Laterality Date   KNEE SURGERY Right     Family History  Problem Relation Age of Onset   Ulcers Mother    Hypertension  Maternal Grandmother    Stroke Maternal Grandfather    Ovarian cancer Paternal Aunt     Allergies  Allergen Reactions   Tetanus Toxoids Swelling    Lip swelling   Percocet [Oxycodone-Acetaminophen] Nausea And Vomiting    Current Outpatient Medications on File Prior to Visit  Medication Sig Dispense Refill   amLODipine (NORVASC) 5 MG tablet Take 1 tablet (5 mg total) by mouth daily. 90 tablet 0   chlorhexidine (PERIDEX) 0.12 % solution SMARTSIG:By Mouth     desloratadine (CLARINEX) 5 MG tablet Take 1 tablet (5 mg total) by mouth daily. 90 tablet 3   losartan (COZAAR) 100 MG tablet Take 1 tablet (100 mg total) by mouth daily. 90 tablet 0   naproxen (NAPROSYN) 250 MG tablet Take 2 tablets (500 mg total) by mouth 2 (two) times daily as needed. 60 tablet 0   rosuvastatin (CRESTOR) 10 MG tablet Take 1 tablet (10 mg total) by mouth daily. 90 tablet 3   No current facility-administered medications on file prior to visit.    There were no vitals taken for this visit.      Objective:   Physical Exam Vitals and nursing note reviewed.  Constitutional:      Appearance: Normal appearance.  Cardiovascular:     Rate and Rhythm:  Normal rate and regular rhythm.     Pulses: Normal pulses.     Heart sounds: Normal heart sounds.  Pulmonary:     Breath sounds: Normal breath sounds.  Skin:    General: Skin is warm and dry.     Capillary Refill: Capillary refill takes less than 2 seconds.  Neurological:     General: No focal deficit present.     Mental Status: He is alert and oriented to person, place, and time.  Psychiatric:        Mood and Affect: Mood normal.        Behavior: Behavior normal.        Thought Content: Thought content normal.        Judgment: Judgment normal.      Assessment & Plan:  1. Essential hypertension - Will increase Norvac from 5 mg to 10 mg. Continue to monitor BP at home and follow up if not at goal.  - amLODipine (NORVASC) 10 MG tablet; Take 1 tablet (10 mg  total) by mouth daily.  Dispense: 90 tablet; Refill: [place], NP  Dorothyann Peng, NP

## 2021-06-28 ENCOUNTER — Ambulatory Visit: Payer: BC Managed Care – PPO | Admitting: Adult Health

## 2021-11-09 ENCOUNTER — Other Ambulatory Visit: Payer: Self-pay | Admitting: Adult Health

## 2021-11-09 DIAGNOSIS — I10 Essential (primary) hypertension: Secondary | ICD-10-CM

## 2022-02-21 ENCOUNTER — Ambulatory Visit (INDEPENDENT_AMBULATORY_CARE_PROVIDER_SITE_OTHER): Payer: BC Managed Care – PPO | Admitting: Dermatology

## 2022-02-21 DIAGNOSIS — L400 Psoriasis vulgaris: Secondary | ICD-10-CM | POA: Diagnosis not present

## 2022-02-21 DIAGNOSIS — Z79899 Other long term (current) drug therapy: Secondary | ICD-10-CM | POA: Diagnosis not present

## 2022-02-21 DIAGNOSIS — R5383 Other fatigue: Secondary | ICD-10-CM | POA: Diagnosis not present

## 2022-02-21 MED ORDER — VTAMA 1 % EX CREA
1.0000 | TOPICAL_CREAM | Freq: Every day | CUTANEOUS | 6 refills | Status: AC
Start: 2022-02-21 — End: ?

## 2022-02-24 LAB — QUANTIFERON-TB GOLD PLUS
Mitogen-NIL: 8.09 IU/mL
NIL: 0.02 IU/mL
QuantiFERON-TB Gold Plus: NEGATIVE
TB1-NIL: 0 IU/mL
TB2-NIL: <0 IU/mL

## 2022-03-16 ENCOUNTER — Encounter: Payer: Self-pay | Admitting: Dermatology

## 2022-03-16 NOTE — Progress Notes (Signed)
   Follow-Up Visit   Subjective  Samuel Lyons is a 51 y.o. male who presents for the following: Psoriasis (Patient here today for psoriasis follow up, patient states that the Orson Ape works great for him. Per patient he's been without medication for 5 months, per patient he is currently flared in both ears, both feet and testicles. Per patient with the [facility] he stays 100% clear.).  Psoriasis Location:  Duration:  Quality:  Associated Signs/Symptoms: Modifying Factors:  Severity:  Timing: Context:   Objective  Well appearing patient in no apparent distress; mood and affect are within normal limits. Plaque psoriasis without arthritis, was 100% clear on Skyrizi but has not had an injection in 5 months with focal new cutaneous involvement.    General skin examination waist up plus genitalia.  Plus joints.   Assessment & Plan    Plaque psoriasis  Recheck TB status and then can okay Skyrizi.  May also use the new noncortisone topical Vtama for localized areas including genitalia if out-of-pocket cost is not too high.  QuantiFERON-TB Gold Plus  Tapinarof (VTAMA) 1 % CREA Apply 1 Application topically daily.  Drug therapy  Related Procedures QuantiFERON-TB Gold Plus  Fatigue, unspecified type  Related Procedures QuantiFERON-TB Gold Plus      I, Lavonna Monarch, MD, have reviewed all documentation for this visit.  The documentation on 03/16/22 for the exam, diagnosis, procedures, and orders are all accurate and complete.

## 2022-06-22 ENCOUNTER — Encounter: Payer: BC Managed Care – PPO | Admitting: Adult Health

## 2022-06-22 NOTE — Progress Notes (Deleted)
Subjective:    Patient ID: Samuel Lyons, male    DOB: 16-Oct-1970, 51 y.o.   MRN: [id number]  HPI Patient presents for yearly preventative medicine examination. He is a pleasant 51 year old male who  has a past medical history of Allergic rhinitis, Essential hypertension, Hyperlipidemia, Psoriasis, and Thrombocytosis.  Hypertension-managed with Cozaar 100 mg daily and Norvasc 10 mg daily.  He denies dizziness, lightheadedness, chest pain, or shortness of breath BP Readings from Last 3 Encounters:  06/23/21 140/90  06/08/21 (!) 138/98  05/16/21 (!) 148/100   Hyperlipidemia- managed with crestor 10 mg daily. He denies mylagia or fatigue  Lab Results  Component Value Date   CHOL 229 (H) 06/08/2021   HDL 42.80 06/08/2021   LDLCALC 154 (H) 06/08/2021   TRIG 161.0 (H) 06/08/2021   CHOLHDL 5 06/08/2021   All immunizations and health maintenance protocols were reviewed with the patient and needed orders were placed.  Appropriate screening laboratory values were ordered for the patient including screening of hyperlipidemia, renal function and hepatic function. If indicated by BPH, a PSA was ordered.  Medication reconciliation,  past medical history, social history, problem list and allergies were reviewed in detail with the patient  Goals were established with regard to weight loss, exercise, and  diet in compliance with medications Wt Readings from Last 3 Encounters:  06/23/21 228 lb (103.4 kg)  06/08/21 228 lb (103.4 kg)  05/16/21 231 lb (104.8 kg)   In the past he has had a referral placed to gastroenterology for routine colon cancer screening.  He never completed this over the year   Review of Systems  Constitutional: Negative.   HENT: Negative.    Eyes: Negative.   Respiratory: Negative.    Cardiovascular: Negative.   Gastrointestinal: Negative.   Endocrine: Negative.   Genitourinary: Negative.   Musculoskeletal: Negative.   Skin: Negative.   Allergic/Immunologic:  Negative.   Neurological: Negative.   Hematological: Negative.   Psychiatric/Behavioral: Negative.    All other systems reviewed and are negative.  Past Medical History:  Diagnosis Date   Allergic rhinitis    Essential hypertension    Hyperlipidemia    Psoriasis    Thrombocytosis     Social History   Socioeconomic History   Marital status: Single    Spouse name: Not on file   Number of children: Not on file   Years of education: Not on file   Highest education level: Not on file  Occupational History   Occupation: Public relations account executive: [employer]  Tobacco Use   Smoking status: Never   Smokeless tobacco: Never  Vaping Use   Vaping Use: Never used  Substance and Sexual Activity   Alcohol use: Yes   Drug use: No   Sexual activity: Not on file  Other Topics Concern   Not on file  Social History Narrative   Married for 7 years- wife's name is Leeanne Mannan up in [place]   Works as a Psychologist, forensic for [employer]   3 children-twins fraternal (son & daughter age 49), son 57   Social Determinants of Radio broadcast assistant Strain: Not on file  Food Insecurity: Not on file  Transportation Needs: Not on file  Physical Activity: Not on file  Stress: Not on file  Social Connections: Not on file  Intimate Partner Violence: Not on file    Past Surgical History:  Procedure Laterality Date   KNEE SURGERY Right  Family History  Problem Relation Age of Onset   Ulcers Mother    Hypertension Maternal Grandmother    Stroke Maternal Grandfather    Ovarian cancer Paternal Aunt     Allergies  Allergen Reactions   Tetanus Toxoids Swelling    Lip swelling   Percocet [Oxycodone-Acetaminophen] Nausea And Vomiting    Current Outpatient Medications on File Prior to Visit  Medication Sig Dispense Refill   amLODipine (NORVASC) 10 MG tablet Take 1 tablet (10 mg total) by mouth daily. 90 tablet 3   chlorhexidine (PERIDEX) 0.12 % solution  SMARTSIG:By Mouth     desloratadine (CLARINEX) 5 MG tablet Take 1 tablet (5 mg total) by mouth daily. 90 tablet 3   losartan (COZAAR) 100 MG tablet TAKE 1 TABLET BY MOUTH DAILY 90 tablet 0   naproxen (NAPROSYN) 250 MG tablet Take 2 tablets (500 mg total) by mouth 2 (two) times daily as needed. 60 tablet 0   rosuvastatin (CRESTOR) 10 MG tablet Take 1 tablet (10 mg total) by mouth daily. 90 tablet 3   Tapinarof (VTAMA) 1 % CREA Apply 1 Application topically daily. 60 g 6   No current facility-administered medications on file prior to visit.    There were no vitals taken for this visit.      Objective:   Physical Exam Vitals and nursing note reviewed.  Constitutional:      General: He is not in acute distress.    Appearance: Normal appearance. He is well-developed and normal weight.  HENT:     Head: Normocephalic and atraumatic.     Right Ear: Tympanic membrane, ear canal and external ear normal. There is no impacted cerumen.     Left Ear: Tympanic membrane, ear canal and external ear normal. There is no impacted cerumen.     Nose: Nose normal. No congestion or rhinorrhea.     Mouth/Throat:     Mouth: Mucous membranes are moist.     Pharynx: Oropharynx is clear. No oropharyngeal exudate or posterior oropharyngeal erythema.  Eyes:     General:        Right eye: No discharge.        Left eye: No discharge.     Extraocular Movements: Extraocular movements intact.     Conjunctiva/sclera: Conjunctivae normal.     Pupils: Pupils are equal, round, and reactive to light.  Neck:     Vascular: No carotid bruit.     Trachea: No tracheal deviation.  Cardiovascular:     Rate and Rhythm: Normal rate and regular rhythm.     Pulses: Normal pulses.     Heart sounds: Normal heart sounds. No murmur heard.    No friction rub. No gallop.  Pulmonary:     Effort: Pulmonary effort is normal. No respiratory distress.     Breath sounds: Normal breath sounds. No stridor. No wheezing, rhonchi or rales.   Chest:     Chest wall: No tenderness.  Abdominal:     General: Bowel sounds are normal. There is no distension.     Palpations: Abdomen is soft. There is no mass.     Tenderness: There is no abdominal tenderness. There is no right CVA tenderness, left CVA tenderness, guarding or rebound.     Hernia: No hernia is present.  Musculoskeletal:        General: No swelling, tenderness, deformity or signs of injury. Normal range of motion.     Right lower leg: No edema.     Left lower  leg: No edema.  Lymphadenopathy:     Cervical: No cervical adenopathy.  Skin:    General: Skin is warm and dry.     Capillary Refill: Capillary refill takes less than 2 seconds.     Coloration: Skin is not jaundiced or pale.     Findings: No bruising, erythema, lesion or rash.  Neurological:     General: No focal deficit present.     Mental Status: He is alert and oriented to person, place, and time.     Cranial Nerves: No cranial nerve deficit.     Sensory: No sensory deficit.     Motor: No weakness.     Coordination: Coordination normal.     Gait: Gait normal.     Deep Tendon Reflexes: Reflexes normal.  Psychiatric:        Mood and Affect: Mood normal.        Behavior: Behavior normal.        Thought Content: Thought content normal.        Judgment: Judgment normal.       Assessment & Plan:

## 2022-06-30 ENCOUNTER — Other Ambulatory Visit: Payer: Self-pay | Admitting: Adult Health

## 2022-07-03 NOTE — Telephone Encounter (Signed)
Pt is due for CPE. Tried to call pt but the call is not going through.

## 2022-08-22 ENCOUNTER — Encounter: Payer: Self-pay | Admitting: Family Medicine

## 2022-08-22 ENCOUNTER — Telehealth: Payer: Self-pay | Admitting: Adult Health

## 2022-08-22 ENCOUNTER — Ambulatory Visit (INDEPENDENT_AMBULATORY_CARE_PROVIDER_SITE_OTHER): Payer: BC Managed Care – PPO | Admitting: Family Medicine

## 2022-08-22 VITALS — BP 130/92 | HR 104 | Temp 98.4°F | Wt 231.0 lb

## 2022-08-22 DIAGNOSIS — D171 Benign lipomatous neoplasm of skin and subcutaneous tissue of trunk: Secondary | ICD-10-CM | POA: Insufficient documentation

## 2022-08-22 DIAGNOSIS — I10 Essential (primary) hypertension: Secondary | ICD-10-CM

## 2022-08-22 MED ORDER — LOSARTAN POTASSIUM 100 MG PO TABS: 100.0000 mg | ORAL_TABLET | Freq: Every day | ORAL | 0 refills | Status: DC

## 2022-08-22 NOTE — Telephone Encounter (Signed)
Noted! Rx sent in for 30 days

## 2022-08-22 NOTE — Telephone Encounter (Signed)
Requesting a work excuse, please call when completed, he will pick it up. Also checking on refills for losartan (COZAAR) 100 MG tablet amLODipine (NORVASC) 10 MG tablet   [id number] - [place], [place] - [address] Phone: [phone]  Fax: [phone]

## 2022-08-22 NOTE — Telephone Encounter (Signed)
Letter sent out statin pt ov for today. Medication will not be filled pt hs not see Tommi Rumps in over a year. Pt needs schedule a CPE

## 2022-08-22 NOTE — Progress Notes (Signed)
   Subjective:    Patient ID: Samuel Lyons, male    DOB: 1970-11-09, 52 y.o.   MRN: [id number]  HPI Here to check a lump on the right upper back that he noticed for the first time a few days ago when his mother hugged him. He said the area was a little tender, and he did not know why. No hx of trauma.    Review of Systems  Constitutional: Negative.   Respiratory: Negative.    Cardiovascular: Negative.        Objective:   Physical Exam Constitutional:      Appearance: Normal appearance.  Cardiovascular:     Rate and Rhythm: Normal rate and regular rhythm.     Pulses: Normal pulses.     Heart sounds: Normal heart sounds.  Pulmonary:     Effort: Pulmonary effort is normal.     Breath sounds: Normal breath sounds.  Musculoskeletal:     Comments: There is a non-tender 8 cm firm mobile lump just under the skin on the right upper back   Neurological:     Mental Status: He is alert.           Assessment & Plan:  This is a lipoma, and we discussed how this is benign. Usually no treatment is needed, but if it becomes more painful it could be removed. He will follow up as needed.  Alysia Penna, MD

## 2022-08-22 NOTE — Telephone Encounter (Signed)
CPE scheduled, letter picked up by patient

## 2022-08-31 ENCOUNTER — Other Ambulatory Visit: Payer: Self-pay | Admitting: Adult Health

## 2022-08-31 DIAGNOSIS — I10 Essential (primary) hypertension: Secondary | ICD-10-CM

## 2022-09-21 ENCOUNTER — Other Ambulatory Visit: Payer: Self-pay | Admitting: Adult Health

## 2022-09-21 ENCOUNTER — Encounter: Payer: Self-pay | Admitting: Adult Health

## 2022-09-21 ENCOUNTER — Ambulatory Visit (INDEPENDENT_AMBULATORY_CARE_PROVIDER_SITE_OTHER): Payer: BC Managed Care – PPO | Admitting: Adult Health

## 2022-09-21 VITALS — BP 140/120 | HR 95 | Temp 98.5°F | Ht 67.75 in | Wt 227.0 lb

## 2022-09-21 DIAGNOSIS — Z125 Encounter for screening for malignant neoplasm of prostate: Secondary | ICD-10-CM

## 2022-09-21 DIAGNOSIS — E782 Mixed hyperlipidemia: Secondary | ICD-10-CM | POA: Diagnosis not present

## 2022-09-21 DIAGNOSIS — Z1211 Encounter for screening for malignant neoplasm of colon: Secondary | ICD-10-CM | POA: Diagnosis not present

## 2022-09-21 DIAGNOSIS — Z0000 Encounter for general adult medical examination without abnormal findings: Secondary | ICD-10-CM

## 2022-09-21 DIAGNOSIS — I10 Essential (primary) hypertension: Secondary | ICD-10-CM | POA: Diagnosis not present

## 2022-09-21 LAB — CBC
HCT: 46.2 % (ref 39.0–52.0)
Hemoglobin: 15.1 g/dL (ref 13.0–17.0)
MCHC: 32.7 g/dL (ref 30.0–36.0)
MCV: 79.8 fl (ref 78.0–100.0)
Platelets: 268 10*3/uL (ref 150.0–400.0)
RBC: 5.79 Mil/uL (ref 4.22–5.81)
RDW: 14.3 % (ref 11.5–15.5)
WBC: 5.4 10*3/uL (ref 4.0–10.5)

## 2022-09-21 LAB — COMPREHENSIVE METABOLIC PANEL
ALT: 34 U/L (ref 0–53)
AST: 25 U/L (ref 0–37)
Albumin: 4.5 g/dL (ref 3.5–5.2)
Alkaline Phosphatase: 204 U/L — ABNORMAL HIGH (ref 39–117)
BUN: 10 mg/dL (ref 6–23)
CO2: 29 mEq/L (ref 19–32)
Calcium: 9.9 mg/dL (ref 8.4–10.5)
Chloride: 100 mEq/L (ref 96–112)
Creatinine, Ser: 1.19 mg/dL (ref 0.40–1.50)
GFR: 71.91 mL/min (ref >60.00)
Glucose, Bld: 87 mg/dL (ref 70–99)
Potassium: 4.5 mEq/L (ref 3.5–5.1)
Sodium: 139 mEq/L (ref 135–145)
Total Bilirubin: 0.5 mg/dL (ref 0.2–1.2)
Total Protein: 7.1 g/dL (ref 6.0–8.3)

## 2022-09-21 LAB — LIPID PANEL
Cholesterol: 214 mg/dL — ABNORMAL HIGH (ref 0–200)
HDL: 44 mg/dL (ref >39.00)
NonHDL: 169.88
Total CHOL/HDL Ratio: 5
Triglycerides: 202 mg/dL — ABNORMAL HIGH (ref 0.0–149.0)
VLDL: 40.4 mg/dL — ABNORMAL HIGH (ref 0.0–40.0)

## 2022-09-21 LAB — TSH: TSH: 1.39 u[IU]/mL (ref 0.35–5.50)

## 2022-09-21 LAB — PSA: PSA: 0.54 ng/mL (ref 0.10–4.00)

## 2022-09-21 LAB — LDL CHOLESTEROL, DIRECT: Direct LDL: 132 mg/dL

## 2022-09-21 MED ORDER — LOSARTAN POTASSIUM 100 MG PO TABS: 100.0000 mg | ORAL_TABLET | Freq: Every day | ORAL | 0 refills | Status: DC

## 2022-09-21 MED ORDER — ROSUVASTATIN CALCIUM 20 MG PO TABS: 20.0000 mg | ORAL_TABLET | Freq: Every day | ORAL | 3 refills | Status: DC

## 2022-09-21 NOTE — Progress Notes (Signed)
Subjective:    Patient ID: Samuel Lyons, male    DOB: [birth year], 52 y.o.   MRN: [id number]  HPI Patient presents for yearly preventative medicine examination. He is a pleasant 52 year old male who  has a past medical history of Allergic rhinitis, Essential hypertension, Hyperlipidemia, Psoriasis, and Thrombocytosis.  HTN -managed with Cozaar 100 mg daily and Norvasc 10 mg daily.  Denies dizziness, lightheadedness, chest pain, shortness of breath. He has not had his blood pressure medication today. He has been out of Losartan but has Norvasc. He does check at home with readings in the 120-130's/70-80's.  BP Readings from Last 3 Encounters:  09/21/22 (!) 140/120  08/22/22 (!) 130/92  06/23/21 140/90   Hyperlipidemia - managed with crestor 10 mg daily. He denies myalgia or fatigue  Lab Results  Component Value Date   CHOL 229 (H) 06/08/2021   HDL 42.80 06/08/2021   LDLCALC 154 (H) 06/08/2021   TRIG 161.0 (H) 06/08/2021   CHOLHDL 5 06/08/2021   Colon Cancer Screening - has not had and is overdue. Referrals have been placed in the past   All immunizations and health maintenance protocols were reviewed with the patient and needed orders were placed.  Appropriate screening laboratory values were ordered for the patient including screening of hyperlipidemia, renal function and hepatic function. If indicated by BPH, a PSA was ordered.  Medication reconciliation,  past medical history, social history, problem list and allergies were reviewed in detail with the patient  Goals were established with regard to weight loss, exercise, and  diet in compliance with medications Wt Readings from Last 3 Encounters:  09/21/22 227 lb (103 kg)  08/22/22 231 lb (104.8 kg)  06/23/21 228 lb (103.4 kg)   He has no acute issues   Review of Systems  Constitutional: Negative.   HENT: Negative.    Eyes: Negative.   Respiratory: Negative.    Cardiovascular: Negative.   Gastrointestinal: Negative.    Endocrine: Negative.   Genitourinary: Negative.   Musculoskeletal: Negative.   Skin: Negative.   Allergic/Immunologic: Negative.   Neurological: Negative.   Hematological: Negative.   Psychiatric/Behavioral: Negative.    All other systems reviewed and are negative.   Past Medical History:  Diagnosis Date   Allergic rhinitis    Essential hypertension    Hyperlipidemia    Psoriasis    Thrombocytosis     Social History   Socioeconomic History   Marital status: Single    Spouse name: Not on file   Number of children: Not on file   Years of education: Not on file   Highest education level: Not on file  Occupational History   Occupation: Public relations account executive: [employer]  Tobacco Use   Smoking status: Never   Smokeless tobacco: Never  Vaping Use   Vaping Use: Never used  Substance and Sexual Activity   Alcohol use: Yes   Drug use: No   Sexual activity: Not on file  Other Topics Concern   Not on file  Social History Narrative   Married for 7 years- wife's name is Leeanne Mannan up in [place]   Works as a Psychologist, forensic for [employer]   3 children-twins fraternal (son & daughter age 56), son 43   Social Determinants of Health   Financial Resource Strain: Not on file  Food Insecurity: Not on file  Transportation Needs: Not on file  Physical Activity: Not on file  Stress: Not  on file  Social Connections: Not on file  Intimate Partner Violence: Not on file    Past Surgical History:  Procedure Laterality Date   KNEE SURGERY Right     Family History  Problem Relation Age of Onset   Ulcers Mother    Hypertension Maternal Grandmother    Stroke Maternal Grandfather    Ovarian cancer Paternal Aunt     Allergies  Allergen Reactions   Tetanus Toxoids Swelling    Lip swelling   Percocet [Oxycodone-Acetaminophen] Nausea And Vomiting    Current Outpatient Medications on File Prior to Visit  Medication Sig Dispense Refill   amLODipine  (NORVASC) 10 MG tablet TAKE ONE TABLET BY MOUTH DAILY 90 tablet 3   chlorhexidine (PERIDEX) 0.12 % solution SMARTSIG:By Mouth     desloratadine (CLARINEX) 5 MG tablet Take 1 tablet (5 mg total) by mouth daily. 90 tablet 3   losartan (COZAAR) 100 MG tablet Take 1 tablet (100 mg total) by mouth daily. 30 tablet 0   naproxen (NAPROSYN) 250 MG tablet Take 2 tablets (500 mg total) by mouth 2 (two) times daily as needed. 60 tablet 0   rosuvastatin (CRESTOR) 10 MG tablet Take 1 tablet (10 mg total) by mouth daily. 90 tablet 3   Tapinarof (VTAMA) 1 % CREA Apply 1 Application topically daily. 60 g 6   No current facility-administered medications on file prior to visit.    BP (!) 140/120   Pulse 95   Temp 98.5 F (36.9 C) (Oral)   Ht 5' 7.75" (1.721 m)   Wt 227 lb (103 kg)   SpO2 95%   BMI 34.77 kg/m        Objective:   Physical Exam Vitals and nursing note reviewed.  Constitutional:      General: He is not in acute distress.    Appearance: Normal appearance. He is well-developed and overweight.  HENT:     Head: Normocephalic and atraumatic.     Right Ear: Tympanic membrane, ear canal and external ear normal. There is no impacted cerumen.     Left Ear: Tympanic membrane, ear canal and external ear normal. There is no impacted cerumen.     Nose: Nose normal. No congestion or rhinorrhea.     Mouth/Throat:     Mouth: Mucous membranes are moist.     Pharynx: Oropharynx is clear. No oropharyngeal exudate or posterior oropharyngeal erythema.  Eyes:     General:        Right eye: No discharge.        Left eye: No discharge.     Extraocular Movements: Extraocular movements intact.     Conjunctiva/sclera: Conjunctivae normal.     Pupils: Pupils are equal, round, and reactive to light.  Neck:     Vascular: No carotid bruit.     Trachea: No tracheal deviation.  Cardiovascular:     Rate and Rhythm: Normal rate and regular rhythm.     Pulses: Normal pulses.     Heart sounds: Normal heart  sounds. No murmur heard.    No friction rub. No gallop.  Pulmonary:     Effort: Pulmonary effort is normal. No respiratory distress.     Breath sounds: Normal breath sounds. No stridor. No wheezing, rhonchi or rales.  Chest:     Chest wall: No tenderness.  Abdominal:     General: Bowel sounds are normal. There is no distension.     Palpations: Abdomen is soft. There is no mass.  Tenderness: There is no abdominal tenderness. There is no right CVA tenderness, left CVA tenderness, guarding or rebound.     Hernia: No hernia is present.  Musculoskeletal:        General: No swelling, tenderness, deformity or signs of injury. Normal range of motion.     Right lower leg: No edema.     Left lower leg: No edema.  Lymphadenopathy:     Cervical: No cervical adenopathy.  Skin:    General: Skin is warm and dry.     Capillary Refill: Capillary refill takes less than 2 seconds.     Coloration: Skin is not jaundiced or pale.     Findings: No bruising, erythema, lesion or rash.  Neurological:     General: No focal deficit present.     Mental Status: He is alert and oriented to person, place, and time.     Cranial Nerves: No cranial nerve deficit.     Sensory: No sensory deficit.     Motor: No weakness.     Coordination: Coordination normal.     Gait: Gait normal.     Deep Tendon Reflexes: Reflexes normal.  Psychiatric:        Mood and Affect: Mood normal.        Behavior: Behavior normal.        Thought Content: Thought content normal.        Judgment: Judgment normal.       Assessment & Plan:   1. Routine general medical examination at a health care facility Today patient counseled on age appropriate routine health concerns for screening and prevention, each reviewed and up to date or declined. Immunizations reviewed and up to date or declined. Labs ordered and reviewed. Risk factors for depression reviewed and negative. Hearing function and visual acuity are intact. ADLs screened and  addressed as needed. Functional ability and level of safety reviewed and appropriate. Education, counseling and referrals performed based on assessed risks today. Patient provided with a copy of personalized plan for preventive services. - Encouraged weight loss through diet and exercise - Follow up in one year or sooner if needed 2. Essential hypertension - Better controlled at home. No change in therapy  - Lipid panel; Future - TSH; Future - CBC; Future - Comprehensive metabolic panel; Future - losartan (COZAAR) 100 MG tablet; Take 1 tablet (100 mg total) by mouth daily.  Dispense: 30 tablet; Refill: 0  3. Mixed hyperlipidemia - Consider increase in statin  - Lipid panel; Future - TSH; Future - CBC; Future - Comprehensive metabolic panel; Future  4. Colon cancer screening  - Cologuard  5. Prostate cancer screening  - PSA; Future   Dorothyann Peng, NP

## 2022-09-21 NOTE — Telephone Encounter (Signed)
  Will file in chart as: rosuvastatin (CRESTOR) 10 MG tablet    The original prescription was discontinued on 09/21/2022 by Dorothyann Peng, NP. Renewing this prescription may not be appropriate.

## 2022-09-21 NOTE — Patient Instructions (Signed)
It was great seeing you today   We will follow up with you regarding your lab work   Please let me know if you need anything   

## 2022-10-01 DIAGNOSIS — Z1211 Encounter for screening for malignant neoplasm of colon: Secondary | ICD-10-CM | POA: Diagnosis not present

## 2022-10-12 LAB — COLOGUARD: COLOGUARD: NEGATIVE

## 2023-01-01 ENCOUNTER — Other Ambulatory Visit: Payer: Self-pay | Admitting: Adult Health

## 2023-01-01 DIAGNOSIS — I10 Essential (primary) hypertension: Secondary | ICD-10-CM

## 2023-01-24 DIAGNOSIS — L400 Psoriasis vulgaris: Secondary | ICD-10-CM | POA: Diagnosis not present

## 2023-01-30 ENCOUNTER — Other Ambulatory Visit: Payer: Self-pay | Admitting: Adult Health

## 2023-01-30 DIAGNOSIS — I10 Essential (primary) hypertension: Secondary | ICD-10-CM

## 2023-02-25 ENCOUNTER — Other Ambulatory Visit: Payer: Self-pay | Admitting: Adult Health

## 2023-02-25 DIAGNOSIS — I10 Essential (primary) hypertension: Secondary | ICD-10-CM

## 2023-03-28 ENCOUNTER — Other Ambulatory Visit: Payer: Self-pay | Admitting: Adult Health

## 2023-03-28 DIAGNOSIS — I10 Essential (primary) hypertension: Secondary | ICD-10-CM

## 2023-04-22 DIAGNOSIS — I10 Essential (primary) hypertension: Secondary | ICD-10-CM

## 2023-05-08 ENCOUNTER — Ambulatory Visit: Payer: BC Managed Care – PPO

## 2023-05-08 VITALS — BP 140/90

## 2023-05-08 DIAGNOSIS — I10 Essential (primary) hypertension: Secondary | ICD-10-CM

## 2023-05-08 NOTE — Progress Notes (Signed)
05/08/2023  Patient ID: Samuel Lyons, male   DOB: [birth year], 52 y.o.   MRN: [id number]   The patients has a diagnosis of hypertension and it is medically necessary for them to have access to a home device to monitor blood pressure.  The patient does not have readily available insurance access to a device and cannot afford to purchase a device at this time.  The patient has been counseled that they do not need to continue to receive services from [facility] to receive a device.  The patient will be given a device free of charge.  Patient presents in office for blood pressure check, does not have a blood pressure meter to monitor at home.  BP does come back as 140/90 today, patient had to return to work before a recheck. Has been running around all day and stressed due to call outs at work. Will use BP monitor on regular basis prior to appt with PCP in Oct.   If continues to be elevated, would consider addition of hydrochlorothiazide or chlorthalidone low dose in future.  Sherrill Raring, PharmD Clinical Pharmacist [phone]

## 2023-05-09 ENCOUNTER — Other Ambulatory Visit: Payer: Self-pay

## 2023-05-09 DIAGNOSIS — I10 Essential (primary) hypertension: Secondary | ICD-10-CM

## 2023-05-09 MED ORDER — LOSARTAN POTASSIUM 100 MG PO TABS
100.0000 mg | ORAL_TABLET | Freq: Every day | ORAL | 0 refills | Status: DC
Start: 2023-05-09 — End: 2023-08-09

## 2023-05-09 MED ORDER — LOSARTAN POTASSIUM 100 MG PO TABS
100.0000 mg | ORAL_TABLET | Freq: Every day | ORAL | 0 refills | Status: DC
Start: 2023-05-09 — End: 2023-05-09

## 2023-05-09 NOTE — Telephone Encounter (Signed)
-----   Message from Sherrill Raring sent at 05/08/2023  2:23 PM EDT ----- Regarding: Med Refill Hello,  Pt requesting refill on their Losartan 100mg  to the following pharmacy:  [facility] [id number] - [place], [place] - [address] P: [phone] F: [phone]   Thank you,  Sherrill Raring, PharmD Clinical Pharmacist [phone]

## 2023-05-14 DIAGNOSIS — R7309 Other abnormal glucose: Secondary | ICD-10-CM | POA: Diagnosis not present

## 2023-06-14 DIAGNOSIS — R7309 Other abnormal glucose: Secondary | ICD-10-CM | POA: Diagnosis not present

## 2023-07-14 DIAGNOSIS — R7309 Other abnormal glucose: Secondary | ICD-10-CM | POA: Diagnosis not present

## 2023-08-09 ENCOUNTER — Other Ambulatory Visit: Payer: Self-pay | Admitting: Adult Health

## 2023-08-09 DIAGNOSIS — I10 Essential (primary) hypertension: Secondary | ICD-10-CM

## 2023-08-14 DIAGNOSIS — R7309 Other abnormal glucose: Secondary | ICD-10-CM | POA: Diagnosis not present

## 2023-09-08 ENCOUNTER — Other Ambulatory Visit: Payer: Self-pay | Admitting: Adult Health

## 2023-09-08 DIAGNOSIS — I10 Essential (primary) hypertension: Secondary | ICD-10-CM

## 2023-09-10 NOTE — Telephone Encounter (Signed)
Will refill for 30 days. Pt needs a  CPE for further refills next month.

## 2023-09-14 DIAGNOSIS — R7309 Other abnormal glucose: Secondary | ICD-10-CM | POA: Diagnosis not present

## 2023-10-09 ENCOUNTER — Other Ambulatory Visit: Payer: Self-pay | Admitting: Adult Health

## 2023-10-09 DIAGNOSIS — I10 Essential (primary) hypertension: Secondary | ICD-10-CM

## 2023-10-12 DIAGNOSIS — R7309 Other abnormal glucose: Secondary | ICD-10-CM | POA: Diagnosis not present

## 2023-11-16 ENCOUNTER — Other Ambulatory Visit: Payer: Self-pay | Admitting: Adult Health

## 2023-11-16 DIAGNOSIS — I10 Essential (primary) hypertension: Secondary | ICD-10-CM

## 2023-12-19 ENCOUNTER — Encounter (HOSPITAL_COMMUNITY): Payer: Self-pay

## 2023-12-26 ENCOUNTER — Encounter: Payer: Self-pay | Admitting: Adult Health

## 2023-12-26 ENCOUNTER — Ambulatory Visit: Admitting: Adult Health

## 2023-12-26 VITALS — BP 140/88 | HR 96 | Temp 98.5°F | Ht 66.25 in | Wt 228.0 lb

## 2023-12-26 DIAGNOSIS — R5383 Other fatigue: Secondary | ICD-10-CM | POA: Diagnosis not present

## 2023-12-26 DIAGNOSIS — E782 Mixed hyperlipidemia: Secondary | ICD-10-CM | POA: Diagnosis not present

## 2023-12-26 DIAGNOSIS — I10 Essential (primary) hypertension: Secondary | ICD-10-CM | POA: Diagnosis not present

## 2023-12-26 DIAGNOSIS — Z0000 Encounter for general adult medical examination without abnormal findings: Secondary | ICD-10-CM | POA: Diagnosis not present

## 2023-12-26 DIAGNOSIS — Z125 Encounter for screening for malignant neoplasm of prostate: Secondary | ICD-10-CM

## 2023-12-26 MED ORDER — LOSARTAN POTASSIUM 100 MG PO TABS: 100.0000 mg | ORAL_TABLET | Freq: Every day | ORAL | 3 refills | Status: AC

## 2023-12-26 MED ORDER — AMLODIPINE BESYLATE 10 MG PO TABS: 10.0000 mg | ORAL_TABLET | Freq: Every day | ORAL | 3 refills | Status: AC

## 2023-12-26 MED ORDER — ROSUVASTATIN CALCIUM 20 MG PO TABS: 20.0000 mg | ORAL_TABLET | Freq: Every day | ORAL | 3 refills | Status: AC

## 2023-12-26 NOTE — Patient Instructions (Signed)
 It was great seeing you today   We will follow up with you regarding your lab work   Please let me know if you need anything

## 2023-12-26 NOTE — Progress Notes (Signed)
 Subjective:    Patient ID: Samuel Lyons, male    DOB: September 27, 1970, 53 y.o.   MRN: [id number]  HPI Patient presents for yearly preventative medicine examination. He is a 53 year old male who  has a past medical history of Allergic rhinitis, Essential hypertension, Hyperlipidemia, Psoriasis, and Thrombocytosis.  HTN -managed with Cozaar  100 mg daily and Norvasc  10 mg daily.  Denies dizziness, lightheadedness, chest pain, shortness of breath. He does check at home with readings in the 120-130's/80's.  BP Readings from Last 3 Encounters:  12/26/23 (!) 140/88  05/08/23 (!) 140/90  09/21/22 (!) 140/120   Hyperlipidemia - managed with crestor  20 mg daily. He denies myalgia or fatigue  Lab Results  Component Value Date   CHOL 214 (H) 09/21/2022   HDL 44.00 09/21/2022   LDLCALC 154 (H) 06/08/2021   LDLDIRECT 132.0 09/21/2022   TRIG 202.0 (H) 09/21/2022   CHOLHDL 5 09/21/2022   Fatigue - he reports that he has noticed that towards the end of the day he feels drained and fatigued. He does snore but does not have apnea. He feels refreshed when he wakes up but does feel like he could take an afternoon nap some times.   Psoriasis - he is prescribed Skyrizi  by dermatology.   All immunizations and health maintenance protocols were reviewed with the patient and needed orders were placed.  Appropriate screening laboratory values were ordered for the patient including screening of hyperlipidemia, renal function and hepatic function. If indicated by BPH, a PSA was ordered.  Medication reconciliation,  past medical history, social history, problem list and allergies were reviewed in detail with the patient  Goals were established with regard to weight loss, exercise, and  diet in compliance with medications. He is working out a few days a week with weighted walks. He is trying to eat healthy.   Wt Readings from Last 3 Encounters:  12/26/23 228 lb (103.4 kg)  09/21/22 227 lb (103 kg)  08/22/22  231 lb (104.8 kg)   He is up to date on routine colon cancer screening   Review of Systems  Constitutional: Negative.   HENT: Negative.    Eyes: Negative.   Respiratory: Negative.    Cardiovascular: Negative.   Gastrointestinal: Negative.   Endocrine: Negative.   Genitourinary: Negative.   Musculoskeletal: Negative.   Skin: Negative.   Allergic/Immunologic: Negative.   Neurological: Negative.   Hematological: Negative.   Psychiatric/Behavioral: Negative.    All other systems reviewed and are negative.  Past Medical History:  Diagnosis Date   Allergic rhinitis    Essential hypertension    Hyperlipidemia    Psoriasis    Thrombocytosis     Social History   Socioeconomic History   Marital status: Married    Spouse name: Not on file   Number of children: Not on file   Years of education: Not on file   Highest education level: Not on file  Occupational History   Occupation: Regulatory affairs officer: [employer]  Tobacco Use   Smoking status: Never   Smokeless tobacco: Never  Vaping Use   Vaping status: Never Used  Substance and Sexual Activity   Alcohol use: Yes   Drug use: No   Sexual activity: Not on file  Other Topics Concern   Not on file  Social History Narrative   Married for 7 years- wife's name is Gaylia Kayser up in [place]   Works as a Camera operator  for [employer]   3 children-twins fraternal (son & daughter age 70), son 53   Social Drivers of Corporate investment banker Strain: Not on Ship broker Insecurity: Not on file  Transportation Needs: Not on file  Physical Activity: Not on file  Stress: Not on file  Social Connections: Not on file  Intimate Partner Violence: Not on file    Past Surgical History:  Procedure Laterality Date   KNEE SURGERY Right     Family History  Problem Relation Age of Onset   Ulcers Mother    Hypertension Maternal Grandmother    Stroke Maternal Grandfather    Ovarian cancer Paternal Aunt      Allergies  Allergen Reactions   Tetanus Toxoids Swelling    Lip swelling   Percocet [Oxycodone -Acetaminophen ] Nausea And Vomiting    Current Outpatient Medications on File Prior to Visit  Medication Sig Dispense Refill   chlorhexidine (PERIDEX) 0.12 % solution SMARTSIG:By Mouth     desloratadine  (CLARINEX ) 5 MG tablet Take 1 tablet (5 mg total) by mouth daily. 90 tablet 3   naproxen  (NAPROSYN ) 250 MG tablet Take 2 tablets (500 mg total) by mouth 2 (two) times daily as needed. 60 tablet 0   SKYRIZI  PEN 150 MG/ML pen Inject into the skin.     Tapinarof  (VTAMA ) 1 % CREA Apply 1 Application topically daily. 60 g 6   No current facility-administered medications on file prior to visit.    BP (!) 140/88   Pulse 96   Temp 98.5 F (36.9 C) (Oral)   Ht 5' 6.25" (1.683 m)   Wt 228 lb (103.4 kg)   SpO2 97%   BMI 36.52 kg/m       Objective:   Physical Exam Vitals and nursing note reviewed.  Constitutional:      General: He is not in acute distress.    Appearance: Normal appearance. He is not ill-appearing.     Comments: BMI elevated due to muscle mass    HENT:     Head: Normocephalic and atraumatic.     Right Ear: Tympanic membrane, ear canal and external ear normal. There is no impacted cerumen.     Left Ear: Tympanic membrane, ear canal and external ear normal. There is no impacted cerumen.     Nose: Nose normal. No congestion or rhinorrhea.     Mouth/Throat:     Mouth: Mucous membranes are moist.     Pharynx: Oropharynx is clear.  Eyes:     Extraocular Movements: Extraocular movements intact.     Conjunctiva/sclera: Conjunctivae normal.     Pupils: Pupils are equal, round, and reactive to light.  Neck:     Vascular: No carotid bruit.  Cardiovascular:     Rate and Rhythm: Normal rate and regular rhythm.     Pulses: Normal pulses.     Heart sounds: No murmur heard.    No friction rub. No gallop.  Pulmonary:     Effort: Pulmonary effort is normal.     Breath  sounds: Normal breath sounds.  Abdominal:     General: Abdomen is flat. Bowel sounds are normal. There is no distension.     Palpations: Abdomen is soft. There is no mass.     Tenderness: There is no abdominal tenderness. There is no guarding or rebound.     Hernia: No hernia is present.  Musculoskeletal:        General: Normal range of motion.     Cervical  back: Normal range of motion and neck supple.  Lymphadenopathy:     Cervical: No cervical adenopathy.  Skin:    General: Skin is warm and dry.     Capillary Refill: Capillary refill takes less than 2 seconds.  Neurological:     General: No focal deficit present.     Mental Status: He is alert and oriented to person, place, and time.  Psychiatric:        Mood and Affect: Mood normal.        Behavior: Behavior normal.        Thought Content: Thought content normal.        Judgment: Judgment normal.       Assessment & Plan:  1. Routine general medical examination at a health care facility (Primary) Today patient counseled on age appropriate routine health concerns for screening and prevention, each reviewed and up to date or declined. Immunizations reviewed and up to date or declined. Labs ordered and reviewed. Risk factors for depression reviewed and negative. Hearing function and visual acuity are intact. ADLs screened and addressed as needed. Functional ability and level of safety reviewed and appropriate. Education, counseling and referrals performed based on assessed risks today. Patient provided with a copy of personalized plan for preventive services. - Get shingles vaccination at pharmacy - Follow up in one year or sooner if needed   2. Primary hypertension - Better controlled at home. No change in medication  - Lipid panel; Future - TSH; Future - CBC; Future - Comprehensive metabolic panel with GFR; Future - amLODipine  (NORVASC ) 10 MG tablet; Take 1 tablet (10 mg total) by mouth daily.  Dispense: 90 tablet; Refill: 3 -  losartan  (COZAAR ) 100 MG tablet; Take 1 tablet (100 mg total) by mouth daily.  Dispense: 90 tablet; Refill: 3  3. Mixed hyperlipidemia - Continue with statin  - Lipid panel; Future - TSH; Future - CBC; Future - Comprehensive metabolic panel with GFR; Future - rosuvastatin  (CRESTOR ) 20 MG tablet; Take 1 tablet (20 mg total) by mouth daily.  Dispense: 90 tablet; Refill: 3  4. Prostate cancer screening  - PSA; Future  5. Fatigue, unspecified type - Will check Testosterone and consider sleep steady  - Lipid panel; Future - TSH; Future - CBC; Future - Comprehensive metabolic panel with GFR; Future - Testosterone Total,Free,Bio, Males; Future   Alto Atta, NP

## 2023-12-31 ENCOUNTER — Other Ambulatory Visit (INDEPENDENT_AMBULATORY_CARE_PROVIDER_SITE_OTHER)

## 2023-12-31 DIAGNOSIS — E782 Mixed hyperlipidemia: Secondary | ICD-10-CM

## 2023-12-31 DIAGNOSIS — R5383 Other fatigue: Secondary | ICD-10-CM

## 2023-12-31 DIAGNOSIS — I10 Essential (primary) hypertension: Secondary | ICD-10-CM | POA: Diagnosis not present

## 2023-12-31 DIAGNOSIS — Z125 Encounter for screening for malignant neoplasm of prostate: Secondary | ICD-10-CM

## 2023-12-31 LAB — COMPREHENSIVE METABOLIC PANEL WITH GFR
ALT: 35 U/L (ref 0–53)
AST: 25 U/L (ref 0–37)
Albumin: 4.4 g/dL (ref 3.5–5.2)
Alkaline Phosphatase: 209 U/L — ABNORMAL HIGH (ref 39–117)
BUN: 14 mg/dL (ref 6–23)
CO2: 21 meq/L (ref 19–32)
Calcium: 9.6 mg/dL (ref 8.4–10.5)
Chloride: 102 meq/L (ref 96–112)
Creatinine, Ser: 0.97 mg/dL (ref 0.40–1.50)
GFR: 91.08 mL/min (ref >60.00)
Glucose, Bld: 97 mg/dL (ref 70–99)
Potassium: 4.3 meq/L (ref 3.5–5.1)
Sodium: 136 meq/L (ref 135–145)
Total Bilirubin: 0.4 mg/dL (ref 0.2–1.2)
Total Protein: 7.1 g/dL (ref 6.0–8.3)

## 2023-12-31 LAB — LIPID PANEL
Cholesterol: 175 mg/dL (ref 0–200)
HDL: 40.5 mg/dL (ref >39.00)
LDL Cholesterol: 98 mg/dL (ref 0–99)
NonHDL: 134.16
Total CHOL/HDL Ratio: 4
Triglycerides: 182 mg/dL — ABNORMAL HIGH (ref 0.0–149.0)
VLDL: 36.4 mg/dL (ref 0.0–40.0)

## 2023-12-31 LAB — TSH: TSH: 1.85 u[IU]/mL (ref 0.35–5.50)

## 2023-12-31 LAB — CBC
HCT: 44.5 % (ref 39.0–52.0)
Hemoglobin: 14.3 g/dL (ref 13.0–17.0)
MCHC: 32 g/dL (ref 30.0–36.0)
MCV: 79.6 fl (ref 78.0–100.0)
Platelets: 239 10*3/uL (ref 150.0–400.0)
RBC: 5.59 Mil/uL (ref 4.22–5.81)
RDW: 15 % (ref 11.5–15.5)
WBC: 4.8 10*3/uL (ref 4.0–10.5)

## 2023-12-31 LAB — PSA: PSA: 0.39 ng/mL (ref 0.10–4.00)

## 2024-01-01 ENCOUNTER — Ambulatory Visit: Payer: Self-pay | Admitting: Adult Health

## 2024-01-01 ENCOUNTER — Telehealth: Payer: Self-pay | Admitting: *Deleted

## 2024-01-01 DIAGNOSIS — E349 Endocrine disorder, unspecified: Secondary | ICD-10-CM

## 2024-01-01 LAB — TESTOSTERONE TOTAL,FREE,BIO, MALES
Albumin: 4.4 g/dL (ref 3.6–5.1)
Sex Hormone Binding: 27 nmol/L (ref 10–50)
Testosterone, Bioavailable: 96 ng/dL — ABNORMAL LOW (ref 110.0–575.0)
Testosterone, Free: 47.7 pg/mL (ref 46.0–224.0)
Testosterone: 317 ng/dL (ref 250–827)

## 2024-01-01 NOTE — Telephone Encounter (Signed)
 Left message to return phone call.

## 2024-01-01 NOTE — Telephone Encounter (Signed)
 Copied from CRM [id number]. Topic: Clinical - Lab/Test Results >> Jan 01, 2024  1:31 PM Samuel Lyons wrote: Reason for CRM: Patient returning missed phone call. Relayed results per providers note, verbatim. Patient verbalized understanding and request a callback regarding urology referral.

## 2024-01-01 NOTE — Telephone Encounter (Signed)
 Noted

## 2024-01-02 NOTE — Telephone Encounter (Signed)
 Left message to return phone call.

## 2024-01-30 DIAGNOSIS — L400 Psoriasis vulgaris: Secondary | ICD-10-CM | POA: Diagnosis not present

## 2024-01-31 ENCOUNTER — Telehealth: Payer: Self-pay

## 2024-01-31 DIAGNOSIS — I10 Essential (primary) hypertension: Secondary | ICD-10-CM

## 2024-01-31 NOTE — Progress Notes (Signed)
 01/31/2024 Name: Samuel Lyons MRN: [id number] DOB: Nov 23, 1970  Chief Complaint  Patient presents with   Medication Management   Hypertension    Samuel Lyons is a 53 y.o. year old male who presented for a telephone visit.   They were referred to the pharmacist by a quality report for assistance in managing hypertension.    Subjective:  Care Team: Primary Care Provider: Alto Atta, NP   Medication Access/Adherence  Current Pharmacy:  [facility] [id number] [place], [place] - [address] [place] [address] Phone: [phone] Fax: [phone]  [facility] - [place], [place] - [address] [facility] [address] [place] [address] Phone: [phone] Fax: [phone]  [facility] [place], [place] - [address] [place] [address] Phone: [phone] Fax: [phone]   Patient reports affordability concerns with their medications: No  Patient reports access/transportation concerns to their pharmacy: No  Patient reports adherence concerns with their medications:  No     Hypertension:  Current medications: Losartan  100mg  daily, Amlodipine  10mg  daily Medications previously tried:   Patient has a validated, automated, upper arm home BP cuff Current blood pressure readings readings: 120/82 most recent, reports most readings are below 140/90, checking about 2-3 times/week  Patient denies hypotensive s/sx including dizziness, lightheadedness.  Patient denies hypertensive symptoms including headache, chest pain, shortness of breath   Objective:  Lab Results  Component Value Date   HGBA1C 6.3 06/08/2021    Lab Results  Component Value Date   CREATININE 0.97 12/31/2023   BUN 14 12/31/2023   NA 136 12/31/2023   K 4.3 12/31/2023   CL 102 12/31/2023   CO2 21 12/31/2023    Lab Results  Component Value Date   CHOL 175 12/31/2023   HDL 40.50 12/31/2023    LDLCALC 98 12/31/2023   LDLDIRECT 132.0 09/21/2022   TRIG 182.0 (H) 12/31/2023   CHOLHDL 4 12/31/2023    Medications Reviewed Today     Reviewed by Carnell Christian, RPH (Pharmacist) on 01/31/24 at 1416  Med List Status: <None>   Medication Order Taking? Sig Documenting Provider Last Dose Status Informant  amLODipine  (NORVASC ) 10 MG tablet [id number] Yes Take 1 tablet (10 mg total) by mouth daily. Nafziger, Randel Buss, NP  Active   chlorhexidine (PERIDEX) 0.12 % solution [id number]  SMARTSIG:By Mouth [provider]  Active   desloratadine  (CLARINEX ) 5 MG tablet [id number]  Take 1 tablet (5 mg total) by mouth daily. Nafziger, Randel Buss, NP  Active   losartan  (COZAAR ) 100 MG tablet [id number] Yes Take 1 tablet (100 mg total) by mouth daily. Nafziger, Randel Buss, NP  Active   naproxen  (NAPROSYN ) 250 MG tablet [id number]  Take 2 tablets (500 mg total) by mouth 2 (two) times daily as needed. Nafziger, Randel Buss, NP  Active   rosuvastatin  (CRESTOR ) 20 MG tablet [id number] Yes Take 1 tablet (20 mg total) by mouth daily. Nafziger, Randel Buss, NP  Active   SKYRIZI  PEN 150 MG/ML pen [id number]  Inject into the skin. [provider]  Active   Tapinarof  (VTAMA ) 1 % CREA [id number]  Apply 1 Application topically daily. Devon Fogo, MD  Active               Assessment/Plan:   Hypertension: - Currently controlled - Reviewed long term cardiovascular and renal outcomes of uncontrolled blood pressure - Reviewed appropriate blood pressure monitoring technique and reviewed goal blood pressure. Recommended to check home  blood pressure and heart rate 2-3x/week - Recommend to continue current medication therapy     Follow Up Plan: Not indicated at this time, patient will reach out as needed  Carnell Christian, PharmD Clinical Pharmacist [phone]

## 2024-02-11 DIAGNOSIS — R7309 Other abnormal glucose: Secondary | ICD-10-CM | POA: Diagnosis not present

## 2024-03-13 DIAGNOSIS — R7309 Other abnormal glucose: Secondary | ICD-10-CM | POA: Diagnosis not present

## 2024-04-13 DIAGNOSIS — R7309 Other abnormal glucose: Secondary | ICD-10-CM | POA: Diagnosis not present

## 2024-04-16 DIAGNOSIS — E291 Testicular hypofunction: Secondary | ICD-10-CM | POA: Diagnosis not present

## 2024-05-13 DIAGNOSIS — R7309 Other abnormal glucose: Secondary | ICD-10-CM | POA: Diagnosis not present

## 2024-06-13 DIAGNOSIS — R7309 Other abnormal glucose: Secondary | ICD-10-CM | POA: Diagnosis not present
